# Patient Record
Sex: Female | Born: 1960 | Race: White | Hispanic: No | Marital: Married | Smoking: Never smoker
Health system: Southern US, Community
[De-identification: ages and names within clinical notes are randomized; demographics above are authoritative.]

## PROBLEM LIST (undated history)

## (undated) DIAGNOSIS — E785 Hyperlipidemia, unspecified: Secondary | ICD-10-CM

## (undated) DIAGNOSIS — R42 Dizziness and giddiness: Secondary | ICD-10-CM

## (undated) HISTORY — DX: Dizziness and giddiness: R42

## (undated) HISTORY — PX: BREAST BIOPSY: SHX20

## (undated) HISTORY — PX: TUBAL LIGATION: SHX77

## (undated) HISTORY — DX: Hyperlipidemia, unspecified: E78.5

---

## 1998-10-20 ENCOUNTER — Other Ambulatory Visit: Admission: RE | Admit: 1998-10-20 | Discharge: 1998-10-20 | Payer: Self-pay | Admitting: Family Medicine

## 2001-10-16 ENCOUNTER — Other Ambulatory Visit: Admission: RE | Admit: 2001-10-16 | Discharge: 2001-10-16 | Payer: Self-pay | Admitting: Obstetrics and Gynecology

## 2001-11-13 ENCOUNTER — Encounter: Admission: RE | Admit: 2001-11-13 | Discharge: 2001-11-13 | Payer: Self-pay | Admitting: General Surgery

## 2001-11-13 ENCOUNTER — Encounter: Payer: Self-pay | Admitting: General Surgery

## 2002-06-19 ENCOUNTER — Encounter (INDEPENDENT_AMBULATORY_CARE_PROVIDER_SITE_OTHER): Payer: Self-pay | Admitting: *Deleted

## 2002-06-19 ENCOUNTER — Ambulatory Visit (HOSPITAL_COMMUNITY): Admission: RE | Admit: 2002-06-19 | Discharge: 2002-06-19 | Payer: Self-pay | Admitting: Obstetrics and Gynecology

## 2002-10-22 ENCOUNTER — Other Ambulatory Visit: Admission: RE | Admit: 2002-10-22 | Discharge: 2002-10-22 | Payer: Self-pay | Admitting: Obstetrics and Gynecology

## 2003-11-18 ENCOUNTER — Other Ambulatory Visit: Admission: RE | Admit: 2003-11-18 | Discharge: 2003-11-18 | Payer: Self-pay | Admitting: Obstetrics and Gynecology

## 2005-01-05 ENCOUNTER — Other Ambulatory Visit: Admission: RE | Admit: 2005-01-05 | Discharge: 2005-01-05 | Payer: Self-pay | Admitting: Obstetrics and Gynecology

## 2007-04-17 ENCOUNTER — Encounter: Admission: RE | Admit: 2007-04-17 | Discharge: 2007-04-17 | Payer: Self-pay | Admitting: Obstetrics and Gynecology

## 2009-02-24 ENCOUNTER — Encounter: Admission: RE | Admit: 2009-02-24 | Discharge: 2009-02-24 | Payer: Self-pay | Admitting: Obstetrics and Gynecology

## 2010-03-30 ENCOUNTER — Encounter: Admission: RE | Admit: 2010-03-30 | Discharge: 2010-03-30 | Payer: Self-pay | Admitting: Obstetrics and Gynecology

## 2010-12-25 NOTE — Op Note (Signed)
   NAME:  Sherri Walter, Sherri Walter                           ACCOUNT NO.:  [id number]   MEDICAL RECORD NO.:  [id number]                   PATIENT TYPE:  AMB   LOCATION:  SDC                                  FACILITY:  [facility]   PHYSICIAN:  Malva Limes, M.D.                 DATE OF BIRTH:  [birth year]   DATE OF PROCEDURE:  06/19/2002  DATE OF DISCHARGE:                                 OPERATIVE REPORT   PREOPERATIVE DIAGNOSES:  Menorrhagia.   POSTOPERATIVE DIAGNOSES:  Menorrhagia.   PROCEDURE:  1. Dilatation and curettage.  2. Cryoablation of endometrial cavity.   SURGEON:  Malva Limes, M.D.   ANESTHESIA:  MAC with paracervical block.   DRAINS:  None.   ANTIBIOTICS:  Ancef 1 g.   SPECIMENS:  Endometrial curettings sent to pathology.   COMPLICATIONS:  None.   PROCEDURE:  The patient was taken to the operating room where she was placed  in the dorsal lithotomy position.  She was prepped with Hibiclens and draped  in the usual fashion for this procedure.  A sterile speculum was placed in  the vagina.  Single tooth tenaculum was applied to the anterior cervical  lip.  20 cc of 1% lidocaine was used for paracervical block.  The cervical  os was then dilated to a 27 [place].  The uterus was sounded to 8 cm.  The  cryoprobe was set to a warm-up cycle.  Once this was completed the probe was  placed in the left cornua and freezing for seven minutes was undertaken.  This probe was then thawed and placed in the opposite cornua.  Another seven  minute freeze was accomplished.  This concluded the procedure.  The patient  was placed in dorsal supine position and transferred to the recovery room in  stable condition.  She will be discharged to home.  She will be instructed  to follow up in the office in four weeks.  She was told to take Motrin as  needed.                                               Malva Limes, M.D.    MA/MEDQ  D:  06/19/2002  T:  06/19/2002  Job:  [id number]

## 2011-04-06 ENCOUNTER — Other Ambulatory Visit: Payer: Self-pay | Admitting: Obstetrics and Gynecology

## 2011-04-06 DIAGNOSIS — Z1231 Encounter for screening mammogram for malignant neoplasm of breast: Secondary | ICD-10-CM

## 2011-04-19 ENCOUNTER — Ambulatory Visit
Admission: RE | Admit: 2011-04-19 | Discharge: 2011-04-19 | Disposition: A | Discharge status: Home / Self Care | Payer: BC Managed Care – PPO | Source: Ambulatory Visit | Attending: Obstetrics and Gynecology | Admitting: Obstetrics and Gynecology

## 2011-04-19 DIAGNOSIS — Z1231 Encounter for screening mammogram for malignant neoplasm of breast: Secondary | ICD-10-CM

## 2012-06-02 ENCOUNTER — Other Ambulatory Visit: Payer: Self-pay | Admitting: Obstetrics and Gynecology

## 2012-06-02 DIAGNOSIS — Z1231 Encounter for screening mammogram for malignant neoplasm of breast: Secondary | ICD-10-CM

## 2012-06-19 ENCOUNTER — Ambulatory Visit
Admission: RE | Admit: 2012-06-19 | Discharge: 2012-06-19 | Disposition: A | Discharge status: Home / Self Care | Payer: BC Managed Care – PPO | Source: Ambulatory Visit | Attending: Obstetrics and Gynecology | Admitting: Obstetrics and Gynecology

## 2012-06-19 ENCOUNTER — Other Ambulatory Visit: Payer: Self-pay | Admitting: Obstetrics and Gynecology

## 2012-06-19 DIAGNOSIS — Z1231 Encounter for screening mammogram for malignant neoplasm of breast: Secondary | ICD-10-CM

## 2012-08-11 ENCOUNTER — Other Ambulatory Visit: Payer: Self-pay | Admitting: Dermatology

## 2013-01-31 ENCOUNTER — Telehealth: Payer: Self-pay | Admitting: Nurse Practitioner

## 2013-01-31 ENCOUNTER — Encounter: Payer: Self-pay | Admitting: General Practice

## 2013-01-31 ENCOUNTER — Ambulatory Visit (INDEPENDENT_AMBULATORY_CARE_PROVIDER_SITE_OTHER): Payer: BC Managed Care – PPO | Admitting: General Practice

## 2013-01-31 VITALS — BP 135/78 | HR 78 | Temp 98.4°F | Ht 62.0 in | Wt 183.0 lb

## 2013-01-31 DIAGNOSIS — K219 Gastro-esophageal reflux disease without esophagitis: Secondary | ICD-10-CM

## 2013-01-31 NOTE — Telephone Encounter (Signed)
appt given for today 

## 2013-01-31 NOTE — Progress Notes (Signed)
  Subjective:    Patient ID: Sherri Walter, female    DOB: Feb 17, 1961, 52 y.o.   MRN: [id number]  Wheezing  This is a new problem. The current episode started today. The problem occurs daily. The problem has been unchanged. Pertinent negatives include no abdominal pain, chest pain, chills, coughing, ear pain, fever, headaches, neck pain, rhinorrhea, shortness of breath or sore throat. Nothing aggravates the symptoms. She has tried nothing for the symptoms. There is no history of asthma or bronchiolitis.  Reports she is has decreased the amount of food food she is eating and in the past she developed acid reflux when decreasing food consumption. She also reports being stressed.     Review of Systems  Constitutional: Negative for fever and chills.  HENT: Positive for voice change. Negative for ear pain, sore throat, rhinorrhea, sneezing, neck pain, neck stiffness, postnasal drip and sinus pressure.        Slightly hoarse this am   Respiratory: Positive for wheezing. Negative for cough, chest tightness and shortness of breath.        Had one episode of wheezing upon waking up this am  Cardiovascular: Negative for chest pain.  Gastrointestinal: Negative for abdominal pain.  Neurological: Negative for dizziness, weakness and headaches.       Objective:   Physical Exam  Constitutional: She is oriented to person, place, and time. She appears well-developed and well-nourished.  HENT:  Head: Normocephalic and atraumatic.  Right Ear: External ear normal.  Left Ear: External ear normal.  Mouth/Throat: Posterior oropharyngeal erythema present.  Neck: Normal range of motion. Neck supple. No thyromegaly present.  Cardiovascular: Normal rate, regular rhythm and normal heart sounds.   Pulmonary/Chest: Effort normal and breath sounds normal. No respiratory distress. She exhibits no tenderness.  Abdominal: Soft. Bowel sounds are normal. She exhibits no distension and no mass.  Lymphadenopathy:    She  has no cervical adenopathy.  Neurological: She is alert and oriented to person, place, and time.  Skin: Skin is warm and dry.  Psychiatric: She has a normal mood and affect.          Assessment & Plan:  1. GERD (gastroesophageal reflux disease) -OTC prilosec as directed -discussed sleeping in elevated position -discussed GERD diet -RTO if symptoms worsen or unresolved -Patient verbalized understanding -Coralie Keens, FNP-C

## 2013-01-31 NOTE — Patient Instructions (Signed)
Gastroesophageal Reflux Disease, Adult  Gastroesophageal reflux disease (GERD) happens when acid from your stomach flows up into the esophagus. When acid comes in contact with the esophagus, the acid causes soreness (inflammation) in the esophagus. Over time, GERD may create small holes (ulcers) in the lining of the esophagus.  CAUSES   · Increased body weight. This puts pressure on the stomach, making acid rise from the stomach into the esophagus.  · Smoking. This increases acid production in the stomach.  · Drinking alcohol. This causes decreased pressure in the lower esophageal sphincter (valve or ring of muscle between the esophagus and stomach), allowing acid from the stomach into the esophagus.  · Late evening meals and a full stomach. This increases pressure and acid production in the stomach.  · A malformed lower esophageal sphincter.  Sometimes, no cause is found.  SYMPTOMS   · Burning pain in the lower part of the mid-chest behind the breastbone and in the mid-stomach area. This may occur twice a week or more often.  · Trouble swallowing.  · Sore throat.  · Dry cough.  · Asthma-like symptoms including chest tightness, shortness of breath, or wheezing.  DIAGNOSIS   Your caregiver may be able to diagnose GERD based on your symptoms. In some cases, X-rays and other tests may be done to check for complications or to check the condition of your stomach and esophagus.  TREATMENT   Your caregiver may recommend over-the-counter or prescription medicines to help decrease acid production. Ask your caregiver before starting or adding any new medicines.   HOME CARE INSTRUCTIONS   · Change the factors that you can control. Ask your caregiver for guidance concerning weight loss, quitting smoking, and alcohol consumption.  · Avoid foods and drinks that make your symptoms worse, such as:  · Caffeine or alcoholic drinks.  · Chocolate.  · Peppermint or mint flavorings.  · Garlic and onions.  · Spicy foods.  · Citrus fruits,  such as oranges, lemons, or limes.  · Tomato-based foods such as sauce, chili, salsa, and pizza.  · Fried and fatty foods.  · Avoid lying down for the 3 hours prior to your bedtime or prior to taking a nap.  · Eat small, frequent meals instead of large meals.  · Wear loose-fitting clothing. Do not wear anything tight around your waist that causes pressure on your stomach.  · Raise the head of your bed 6 to 8 inches with wood blocks to help you sleep. Extra pillows will not help.  · Only take over-the-counter or prescription medicines for pain, discomfort, or fever as directed by your caregiver.  · Do not take aspirin, ibuprofen, or other nonsteroidal anti-inflammatory drugs (NSAIDs).  SEEK IMMEDIATE MEDICAL CARE IF:   · You have pain in your arms, neck, jaw, teeth, or back.  · Your pain increases or changes in intensity or duration.  · You develop nausea, vomiting, or sweating (diaphoresis).  · You develop shortness of breath, or you faint.  · Your vomit is green, yellow, black, or looks like coffee grounds or blood.  · Your stool is red, bloody, or black.  These symptoms could be signs of other problems, such as heart disease, gastric bleeding, or esophageal bleeding.  MAKE SURE YOU:   · Understand these instructions.  · Will watch your condition.  · Will get help right away if you are not doing well or get worse.  Document Released: 05/05/2005 Document Revised: 10/18/2011 Document Reviewed: 02/12/2011  Exit[employer]® Patient   Information ©2014 [employer].

## 2013-02-02 ENCOUNTER — Other Ambulatory Visit: Payer: Self-pay | Admitting: General Practice

## 2013-02-02 ENCOUNTER — Telehealth: Payer: Self-pay | Admitting: General Practice

## 2013-02-02 DIAGNOSIS — J322 Chronic ethmoidal sinusitis: Secondary | ICD-10-CM

## 2013-02-02 MED ORDER — AZITHROMYCIN 250 MG PO TABS: ORAL_TABLET | ORAL | Status: DC

## 2013-02-02 NOTE — Telephone Encounter (Signed)
Patient aware.

## 2013-02-02 NOTE — Telephone Encounter (Signed)
Wants something called in, ha, pressure is worse- feels like deep sinus infection- please call in med Just seen 6/25- Mae H.   [facility] [place]

## 2013-02-02 NOTE — Telephone Encounter (Signed)
Please inform that [facility]. thx

## 2013-02-12 ENCOUNTER — Telehealth: Payer: Self-pay | Admitting: General Practice

## 2013-02-12 ENCOUNTER — Other Ambulatory Visit: Payer: Self-pay | Admitting: General Practice

## 2013-02-12 MED ORDER — AMOXICILLIN 500 MG PO CAPS: 500.0000 mg | ORAL_CAPSULE | Freq: Two times a day (BID) | ORAL | Status: DC

## 2013-02-12 NOTE — Telephone Encounter (Signed)
Please inform that amoxicillin script sent to pharmacy. If symptoms continue after completion, she will need to be seen. thx

## 2013-02-12 NOTE — Telephone Encounter (Signed)
She may want another antibiotic.

## 2013-02-12 NOTE — Telephone Encounter (Signed)
Aware. 

## 2013-06-15 ENCOUNTER — Other Ambulatory Visit: Payer: Self-pay

## 2013-06-15 DIAGNOSIS — Z1231 Encounter for screening mammogram for malignant neoplasm of breast: Secondary | ICD-10-CM

## 2013-06-22 ENCOUNTER — Other Ambulatory Visit: Payer: Self-pay | Admitting: Obstetrics and Gynecology

## 2013-07-06 ENCOUNTER — Ambulatory Visit
Admission: RE | Admit: 2013-07-06 | Discharge: 2013-07-06 | Disposition: A | Discharge status: Home / Self Care | Payer: BC Managed Care – PPO | Source: Ambulatory Visit

## 2013-07-06 DIAGNOSIS — Z1231 Encounter for screening mammogram for malignant neoplasm of breast: Secondary | ICD-10-CM

## 2014-06-11 ENCOUNTER — Other Ambulatory Visit: Payer: Self-pay

## 2014-06-11 DIAGNOSIS — Z1231 Encounter for screening mammogram for malignant neoplasm of breast: Secondary | ICD-10-CM

## 2014-06-24 ENCOUNTER — Other Ambulatory Visit: Payer: Self-pay | Admitting: Obstetrics and Gynecology

## 2014-06-25 LAB — CYTOLOGY - PAP

## 2014-07-08 ENCOUNTER — Ambulatory Visit
Admission: RE | Admit: 2014-07-08 | Discharge: 2014-07-08 | Disposition: A | Discharge status: Home / Self Care | Payer: BC Managed Care – PPO | Source: Ambulatory Visit

## 2014-07-08 DIAGNOSIS — Z1231 Encounter for screening mammogram for malignant neoplasm of breast: Secondary | ICD-10-CM

## 2015-06-03 ENCOUNTER — Other Ambulatory Visit: Payer: Self-pay

## 2015-06-03 DIAGNOSIS — Z1231 Encounter for screening mammogram for malignant neoplasm of breast: Secondary | ICD-10-CM

## 2015-06-09 ENCOUNTER — Other Ambulatory Visit: Payer: Self-pay | Admitting: Gastroenterology

## 2015-07-01 ENCOUNTER — Other Ambulatory Visit: Payer: Self-pay | Admitting: Obstetrics and Gynecology

## 2015-07-02 LAB — CYTOLOGY - PAP

## 2015-07-11 ENCOUNTER — Ambulatory Visit
Admission: RE | Admit: 2015-07-11 | Discharge: 2015-07-11 | Disposition: A | Discharge status: Home / Self Care | Payer: BLUE CROSS/BLUE SHIELD | Source: Ambulatory Visit

## 2015-07-11 DIAGNOSIS — Z1231 Encounter for screening mammogram for malignant neoplasm of breast: Secondary | ICD-10-CM

## 2015-10-03 ENCOUNTER — Ambulatory Visit (INDEPENDENT_AMBULATORY_CARE_PROVIDER_SITE_OTHER): Payer: BLUE CROSS/BLUE SHIELD | Admitting: Nurse Practitioner

## 2015-10-03 ENCOUNTER — Encounter: Payer: Self-pay | Admitting: Nurse Practitioner

## 2015-10-03 VITALS — BP 134/80 | HR 93 | Temp 101.3°F | Ht 62.0 in | Wt 182.5 lb

## 2015-10-03 DIAGNOSIS — R509 Fever, unspecified: Secondary | ICD-10-CM | POA: Diagnosis not present

## 2015-10-03 DIAGNOSIS — J069 Acute upper respiratory infection, unspecified: Secondary | ICD-10-CM

## 2015-10-03 LAB — POCT INFLUENZA A/B
INFLUENZA A, POC: NEGATIVE
Influenza B, POC: NEGATIVE

## 2015-10-03 MED ORDER — AZITHROMYCIN 250 MG PO TABS: ORAL_TABLET | ORAL | Status: DC

## 2015-10-03 NOTE — Patient Instructions (Signed)

## 2015-10-03 NOTE — Progress Notes (Signed)
   Subjective:    Patient ID: Sherri Walter, female    DOB: [birth year], 55 y.o.   MRN: [id number]  HPI  Patient in c/o cough and congestion- 101 fever 30 minutes ago. Started Wednesday with just congestion and now she has a cough. Has been taking mucinex OTC which has helped symptoms some.   Review of Systems  Constitutional: Negative for chills.  HENT: Positive for congestion, ear pain and sinus pressure. Negative for sore throat and voice change.   Respiratory: Positive for cough. Negative for shortness of breath.   Cardiovascular: Negative.   Genitourinary: Negative.   Neurological: Positive for headaches (dull).  Psychiatric/Behavioral: Negative.   All other systems reviewed and are negative.      Objective:   Physical Exam  Constitutional: She appears well-developed and well-nourished.  HENT:  Right Ear: Hearing, tympanic membrane, external ear and ear canal normal.  Left Ear: Hearing, tympanic membrane, external ear and ear canal normal.  Nose: Mucosal edema and rhinorrhea present. Right sinus exhibits maxillary sinus tenderness. Right sinus exhibits no frontal sinus tenderness. Left sinus exhibits maxillary sinus tenderness. Left sinus exhibits no frontal sinus tenderness.  Mouth/Throat: Uvula is midline, oropharynx is clear and moist and mucous membranes are normal.  Eyes: Pupils are equal, round, and reactive to light.  Neck: Normal range of motion. Neck supple.  Cardiovascular: Normal rate, regular rhythm and normal heart sounds.   Pulmonary/Chest: Effort normal and breath sounds normal.  Neurological: She is alert.  Skin: Skin is warm.  Face flushed  Psychiatric: She has a normal mood and affect. Her behavior is normal. Judgment and thought content normal.    BP 134/80 mmHg  Pulse 93  Temp(Src) 101.3 F (38.5 C) (Oral)  Ht 5\' 2"  (1.575 m)  Wt 182 lb 8 oz (82.781 kg)  BMI 33.37 kg/m2      Assessment & Plan:   1. Fever, unspecified   2. Upper respiratory  infection with cough and congestion    1. Take meds as prescribed 2. Use a cool mist humidifier especially during the winter months and when heat has been humid. 3. Use saline nose sprays frequently 4. Saline irrigations of the nose can be very helpful if done frequently.  * 4X daily for 1 week*  * Use of a nettie pot can be helpful with this. Follow directions with this* 5. Drink plenty of fluids 6. Keep thermostat turn down low 7.For any cough or congestion  Use plain Mucinex- regular strength or max strength is fine   * Children- consult with Pharmacist for dosing 8. For fever or aces or pains- take tylenol or ibuprofen appropriate for age and weight.  * for fevers greater than 101 orally you may alternate ibuprofen and tylenol every  3 hours.   Meds ordered this encounter  Medications  . azithromycin (ZITHROMAX Z-PAK) 250 MG tablet    Sig: As directed    Dispense:  1 each    Refill:  0    Order Specific Question:  Supervising Provider    Answer:  Chipper Herb [[id number]]   [place], FNP

## 2015-12-29 DIAGNOSIS — D2371 Other benign neoplasm of skin of right lower limb, including hip: Secondary | ICD-10-CM | POA: Diagnosis not present

## 2015-12-29 DIAGNOSIS — L918 Other hypertrophic disorders of the skin: Secondary | ICD-10-CM | POA: Diagnosis not present

## 2015-12-29 DIAGNOSIS — L821 Other seborrheic keratosis: Secondary | ICD-10-CM | POA: Diagnosis not present

## 2015-12-29 DIAGNOSIS — L570 Actinic keratosis: Secondary | ICD-10-CM | POA: Diagnosis not present

## 2016-02-21 ENCOUNTER — Encounter: Payer: Self-pay | Admitting: Family Medicine

## 2016-02-21 ENCOUNTER — Ambulatory Visit (INDEPENDENT_AMBULATORY_CARE_PROVIDER_SITE_OTHER): Payer: BLUE CROSS/BLUE SHIELD | Admitting: Family Medicine

## 2016-02-21 VITALS — BP 114/64 | HR 71 | Temp 97.1°F | Ht 62.0 in | Wt 187.6 lb

## 2016-02-21 DIAGNOSIS — S81849A Puncture wound with foreign body, unspecified lower leg, initial encounter: Secondary | ICD-10-CM | POA: Diagnosis not present

## 2016-02-21 DIAGNOSIS — Z23 Encounter for immunization: Secondary | ICD-10-CM | POA: Diagnosis not present

## 2016-02-21 DIAGNOSIS — W6133XA Pecked by chicken, initial encounter: Secondary | ICD-10-CM

## 2016-02-21 NOTE — Progress Notes (Signed)
BP 114/64 mmHg  Pulse 71  Temp(Src) 97.1 F (36.2 C) (Oral)  Ht 5\' 2"  (1.575 m)  Wt 187 lb 9.6 oz (85.095 kg)  BMI 34.30 kg/m2   Subjective:    Patient ID: Sherri Walter, female    DOB: 05-28-1961, 55 y.o.   MRN: [id number]  HPI: Sherri Walter is a 55 y.o. female presenting on 02/21/2016 for scratched by rooster and wants tetanus injection   HPI Rooster scratch and attack Patient was attacked by her daughters rooster a week ago. She has a few spots on her legs where she's been scratched and there were initially healing and then they started to develop a rash around him then she is vinegar and now they're healing again but she does want to get him checked and get a tetanus booster because she has not had one in some time. She denies any fevers or chills or redness or warmth. The one site that she is most concerned about is on her right calf. And she has a small healing scab that's less than 15 mm in size. She has slight tenderness to it but no warmth or drainage or redness. She says the vinegar was what helps get rid of the redness that had developed. All other sites are healing well  Relevant past medical, surgical, family and social history reviewed and updated as indicated. Interim medical history since our last visit reviewed. Allergies and medications reviewed and updated.  Review of Systems  Constitutional: Negative for fever and chills.  HENT: Negative for congestion, ear discharge and ear pain.   Eyes: Negative for redness and visual disturbance.  Respiratory: Negative for chest tightness and shortness of breath.   Cardiovascular: Negative for chest pain and leg swelling.  Genitourinary: Negative for dysuria and difficulty urinating.  Musculoskeletal: Negative for back pain and gait problem.  Skin: Positive for wound. Negative for color change and rash.  Neurological: Negative for light-headedness and headaches.  Psychiatric/Behavioral: Negative for behavioral problems and  agitation.  All other systems reviewed and are negative.   Per HPI unless specifically indicated above     Medication List    Notice  As of 02/21/2016  8:55 AM   You have not been prescribed any medications.         Objective:    BP 114/64 mmHg  Pulse 71  Temp(Src) 97.1 F (36.2 C) (Oral)  Ht 5\' 2"  (1.575 m)  Wt 187 lb 9.6 oz (85.095 kg)  BMI 34.30 kg/m2  Wt Readings from Last 3 Encounters:  02/21/16 187 lb 9.6 oz (85.095 kg)  10/03/15 182 lb 8 oz (82.781 kg)  01/31/13 183 lb (83.008 kg)    Physical Exam  Constitutional: She is oriented to person, place, and time. She appears well-developed and well-nourished. No distress.  Eyes: Conjunctivae and EOM are normal. Pupils are equal, round, and reactive to light.  Cardiovascular: Normal rate, regular rhythm, normal heart sounds and intact distal pulses.   No murmur heard. Pulmonary/Chest: Effort normal and breath sounds normal. No respiratory distress. She has no wheezes.  Musculoskeletal: Normal range of motion. She exhibits no edema or tenderness.  Neurological: She is alert and oriented to person, place, and time. Coordination normal.  Skin: Skin is warm and dry. Lesion (Small healing lesion, no erythema or warmth. Slightly tender to palpation. No induration or fluctuation noted.) noted. No rash noted. She is not diaphoretic.  Psychiatric: She has a normal mood and affect. Her behavior is normal.  Nursing note and vitals reviewed.      Assessment & Plan:   Problem List Items Addressed This Visit    None    Visit Diagnoses    Pecked by chicken, initial encounter    -  Primary    Patient was attacked by rooster, has been using vinegar and site appears to be healing. Recommended topical antibiotic and moisturizing lotion, return if worsen       Follow up plan: Return if symptoms worsen or fail to improve.  Counseling provided for all of the vaccine components Orders Placed This Encounter  Procedures  . Tdap  vaccine greater than or equal to 7yo IM    Caryl Pina, MD [facility] 02/21/2016, 8:55 AM

## 2016-03-16 DIAGNOSIS — M542 Cervicalgia: Secondary | ICD-10-CM | POA: Diagnosis not present

## 2016-03-16 DIAGNOSIS — M25511 Pain in right shoulder: Secondary | ICD-10-CM | POA: Diagnosis not present

## 2016-03-16 DIAGNOSIS — M7541 Impingement syndrome of right shoulder: Secondary | ICD-10-CM | POA: Diagnosis not present

## 2016-03-16 DIAGNOSIS — M7501 Adhesive capsulitis of right shoulder: Secondary | ICD-10-CM | POA: Diagnosis not present

## 2016-03-18 DIAGNOSIS — M7501 Adhesive capsulitis of right shoulder: Secondary | ICD-10-CM | POA: Diagnosis not present

## 2016-03-22 DIAGNOSIS — M7501 Adhesive capsulitis of right shoulder: Secondary | ICD-10-CM | POA: Diagnosis not present

## 2016-03-26 DIAGNOSIS — M7501 Adhesive capsulitis of right shoulder: Secondary | ICD-10-CM | POA: Diagnosis not present

## 2016-03-29 DIAGNOSIS — M7501 Adhesive capsulitis of right shoulder: Secondary | ICD-10-CM | POA: Diagnosis not present

## 2016-04-02 DIAGNOSIS — M7501 Adhesive capsulitis of right shoulder: Secondary | ICD-10-CM | POA: Diagnosis not present

## 2016-04-05 DIAGNOSIS — M7501 Adhesive capsulitis of right shoulder: Secondary | ICD-10-CM | POA: Diagnosis not present

## 2016-04-09 DIAGNOSIS — M7501 Adhesive capsulitis of right shoulder: Secondary | ICD-10-CM | POA: Diagnosis not present

## 2016-04-13 DIAGNOSIS — M7501 Adhesive capsulitis of right shoulder: Secondary | ICD-10-CM | POA: Diagnosis not present

## 2016-04-16 DIAGNOSIS — M7501 Adhesive capsulitis of right shoulder: Secondary | ICD-10-CM | POA: Diagnosis not present

## 2016-04-19 DIAGNOSIS — M25511 Pain in right shoulder: Secondary | ICD-10-CM | POA: Diagnosis not present

## 2016-04-19 DIAGNOSIS — M7541 Impingement syndrome of right shoulder: Secondary | ICD-10-CM | POA: Diagnosis not present

## 2016-04-19 DIAGNOSIS — M7501 Adhesive capsulitis of right shoulder: Secondary | ICD-10-CM | POA: Diagnosis not present

## 2016-04-19 DIAGNOSIS — M542 Cervicalgia: Secondary | ICD-10-CM | POA: Diagnosis not present

## 2016-06-03 ENCOUNTER — Other Ambulatory Visit: Payer: Self-pay | Admitting: Obstetrics and Gynecology

## 2016-06-03 DIAGNOSIS — Z1231 Encounter for screening mammogram for malignant neoplasm of breast: Secondary | ICD-10-CM

## 2016-07-05 ENCOUNTER — Other Ambulatory Visit: Payer: Self-pay | Admitting: Obstetrics and Gynecology

## 2016-07-05 DIAGNOSIS — Z1389 Encounter for screening for other disorder: Secondary | ICD-10-CM | POA: Diagnosis not present

## 2016-07-05 DIAGNOSIS — Z124 Encounter for screening for malignant neoplasm of cervix: Secondary | ICD-10-CM | POA: Diagnosis not present

## 2016-07-05 DIAGNOSIS — Z6835 Body mass index (BMI) 35.0-35.9, adult: Secondary | ICD-10-CM | POA: Diagnosis not present

## 2016-07-05 DIAGNOSIS — Z13220 Encounter for screening for lipoid disorders: Secondary | ICD-10-CM | POA: Diagnosis not present

## 2016-07-05 DIAGNOSIS — Z131 Encounter for screening for diabetes mellitus: Secondary | ICD-10-CM | POA: Diagnosis not present

## 2016-07-05 DIAGNOSIS — N912 Amenorrhea, unspecified: Secondary | ICD-10-CM | POA: Diagnosis not present

## 2016-07-05 DIAGNOSIS — Z01419 Encounter for gynecological examination (general) (routine) without abnormal findings: Secondary | ICD-10-CM | POA: Diagnosis not present

## 2016-07-06 LAB — CYTOLOGY - PAP

## 2016-07-12 ENCOUNTER — Ambulatory Visit
Admission: RE | Admit: 2016-07-12 | Discharge: 2016-07-12 | Disposition: A | Discharge status: Home / Self Care | Payer: BLUE CROSS/BLUE SHIELD | Source: Ambulatory Visit | Attending: Obstetrics and Gynecology | Admitting: Obstetrics and Gynecology

## 2016-07-12 DIAGNOSIS — Z1231 Encounter for screening mammogram for malignant neoplasm of breast: Secondary | ICD-10-CM

## 2016-08-10 ENCOUNTER — Encounter: Payer: Self-pay | Admitting: Family Medicine

## 2017-01-10 DIAGNOSIS — L812 Freckles: Secondary | ICD-10-CM | POA: Diagnosis not present

## 2017-01-10 DIAGNOSIS — L570 Actinic keratosis: Secondary | ICD-10-CM | POA: Diagnosis not present

## 2017-01-10 DIAGNOSIS — L821 Other seborrheic keratosis: Secondary | ICD-10-CM | POA: Diagnosis not present

## 2017-01-10 DIAGNOSIS — D2271 Melanocytic nevi of right lower limb, including hip: Secondary | ICD-10-CM | POA: Diagnosis not present

## 2017-04-04 ENCOUNTER — Encounter: Payer: Self-pay | Admitting: Family Medicine

## 2017-04-04 ENCOUNTER — Ambulatory Visit (INDEPENDENT_AMBULATORY_CARE_PROVIDER_SITE_OTHER): Payer: BLUE CROSS/BLUE SHIELD | Admitting: Family Medicine

## 2017-04-04 VITALS — BP 130/75 | HR 73 | Temp 97.8°F | Ht 62.0 in | Wt 185.0 lb

## 2017-04-04 DIAGNOSIS — R112 Nausea with vomiting, unspecified: Secondary | ICD-10-CM | POA: Diagnosis not present

## 2017-04-04 MED ORDER — ONDANSETRON HCL 4 MG PO TABS: 4.0000 mg | ORAL_TABLET | Freq: Three times a day (TID) | ORAL | 0 refills | Status: DC | PRN

## 2017-04-04 MED ORDER — PROMETHAZINE HCL 25 MG/ML IJ SOLN
25.0000 mg | Freq: Once | INTRAMUSCULAR | Status: AC
  Administered 2017-04-04: 25 mg via INTRAMUSCULAR

## 2017-04-04 NOTE — Patient Instructions (Signed)
Great to meet you!  Please let us know if your symptoms worsen or do not resolve as expected.    This is likely a viral GI infection or food poisoning, either one should resolve generally within 48 hours.   Nausea and Vomiting, Adult Feeling sick to your stomach (nausea) means that your stomach is upset or you feel like you have to throw up (vomit). Feeling more and more sick to your stomach can lead to throwing up. Throwing up happens when food and liquid from your stomach are thrown up and out the mouth. Throwing up can make you feel weak and cause you to get dehydrated. Dehydration can make you tired and thirsty, make you have a dry mouth, and make it so you pee (urinate) less often. Older adults and people with other diseases or a weak defense system (immune system) are at higher risk for dehydration. If you feel sick to your stomach or if you throw up, it is important to follow instructions from your doctor about how to take care of yourself. Follow these instructions at home: Eating and drinking Follow these instructions as told by your doctor:  Take an oral rehydration solution (ORS). This is a drink that is sold at pharmacies and stores.  Drink clear fluids in small amounts as you are able, such as: ? Water. ? Ice chips. ? Diluted fruit juice. ? Low-calorie sports drinks.  Eat bland, easy-to-digest foods in small amounts as you are able, such as: ? Bananas. ? Applesauce. ? Rice. ? Low-fat (lean) meats. ? Toast. ? Crackers.  Avoid fluids that have a lot of sugar or caffeine in them.  Avoid alcohol.  Avoid spicy or fatty foods.  General instructions  Drink enough fluid to keep your pee (urine) clear or pale yellow.  Wash your hands often. If you cannot use soap and water, use hand sanitizer.  Make sure that all people in your home wash their hands well and often.  Take over-the-counter and prescription medicines only as told by your doctor.  Rest at home while you  get better.  Watch your condition for any changes.  Breathe slowly and deeply when you feel sick to your stomach.  Keep all follow-up visits as told by your doctor. This is important. Contact a doctor if:  You have a fever.  You cannot keep fluids down.  Your symptoms get worse.  You have new symptoms.  You feel sick to your stomach for more than two days.  You feel light-headed or dizzy.  You have a headache.  You have muscle cramps. Get help right away if:  You have pain in your chest, neck, arm, or jaw.  You feel very weak or you pass out (faint).  You throw up again and again.  You see blood in your throw-up.  Your throw-up looks like black coffee grounds.  You have bloody or black poop (stools) or poop that look like tar.  You have a very bad headache, a stiff neck, or both.  You have a rash.  You have very bad pain, cramping, or bloating in your belly (abdomen).  You have trouble breathing.  You are breathing very quickly.  Your heart is beating very quickly.  Your skin feels cold and clammy.  You feel confused.  You have pain when you pee.  You have signs of dehydration, such as: ? Dark pee, hardly any pee, or no pee. ? Cracked lips. ? Dry mouth. ? Sunken eyes. ? Sleepiness. ?  Weakness. These symptoms may be an emergency. Do not wait to see if the symptoms will go away. Get medical help right away. Call your local emergency services (911 in the [place] Do not drive yourself to the hospital. This information is not intended to replace advice given to you by your health care provider. Make sure you discuss any questions you have with your health care provider. Document Released: 01/12/2008 Document Revised: 02/13/2016 Document Reviewed: 04/01/2015 Elsevier Interactive Patient Education  2018 [employer].

## 2017-04-04 NOTE — Progress Notes (Signed)
   HPI  Patient presents today here with nausea and vomiting.  Patient's lines of her symptoms started this morning at 3 AM, she's had 10 or more episodes of vomiting and diarrhea. She states that her vomiting started out as food contents and ended clear and mucousy  Her stools became watery over the process.  She denies abdominal pain. She denies any suspect foods or sick contacts. She did take care of her 66-month-old granddaughter yesterday.  She states that the very last vomiting episode did have a few bloody streaks in it  Symptoms have almost completely stopped over the last hour and a half.  She is tolerating small sips of fluid without vomiting, she does have continued nausea  PMH: Smoking status noted ROS: Per HPI  Objective: BP 130/75   Pulse 73   Temp 97.8 F (36.6 C) (Oral)   Ht 5\' 2"  (1.575 m)   Wt 185 lb (83.9 kg)   BMI 33.84 kg/m  Gen: NAD, alert, cooperative with exam HEENT: NCAT CV: RRR, good S1/S2, no murmur Resp: CTABL, no wheezes, non-labored Abd: SNTND, BS present, no guarding or organomegaly Ext: No edema, warm Neuro: Alert and oriented, No gross deficits  Assessment and plan:  # Nausea and vomiting Patient requesting IM injection of nausea medicine today, Phenergan given Zofran for continued symptoms if needed Symptoms appear to be finishing Likely gastroenteritis versus food poisoning. Discussed red flags, return to clinic as needed    Meds ordered this encounter  Medications  . Omega-3 Fatty Acids (FISH OIL) 1000 MG CAPS    Sig: Take by mouth.  . Vitamin D, Ergocalciferol, (DRISDOL) 50000 units CAPS capsule    Sig: Take 50,000 Units by mouth every 7 (seven) days.  . Biotin 10000 MCG TABS    Sig: Take by mouth.  . promethazine (PHENERGAN) injection 25 mg  . ondansetron (ZOFRAN) 4 MG tablet    Sig: Take 1 tablet (4 mg total) by mouth every 8 (eight) hours as needed for nausea or vomiting.    Dispense:  20 tablet    Refill:  0     Laroy Apple, MD [facility] 04/04/2017, 9:34 AM

## 2017-06-15 ENCOUNTER — Other Ambulatory Visit: Payer: Self-pay | Admitting: Obstetrics and Gynecology

## 2017-06-15 DIAGNOSIS — Z1231 Encounter for screening mammogram for malignant neoplasm of breast: Secondary | ICD-10-CM

## 2017-07-11 DIAGNOSIS — Z124 Encounter for screening for malignant neoplasm of cervix: Secondary | ICD-10-CM | POA: Diagnosis not present

## 2017-07-11 DIAGNOSIS — Z6835 Body mass index (BMI) 35.0-35.9, adult: Secondary | ICD-10-CM | POA: Diagnosis not present

## 2017-07-11 DIAGNOSIS — Z01419 Encounter for gynecological examination (general) (routine) without abnormal findings: Secondary | ICD-10-CM | POA: Diagnosis not present

## 2017-07-15 ENCOUNTER — Ambulatory Visit
Admission: RE | Admit: 2017-07-15 | Discharge: 2017-07-15 | Disposition: A | Discharge status: Home / Self Care | Payer: BLUE CROSS/BLUE SHIELD | Source: Ambulatory Visit | Attending: Obstetrics and Gynecology | Admitting: Obstetrics and Gynecology

## 2017-07-15 DIAGNOSIS — Z1231 Encounter for screening mammogram for malignant neoplasm of breast: Secondary | ICD-10-CM | POA: Diagnosis not present

## 2017-07-25 ENCOUNTER — Encounter: Payer: Self-pay | Admitting: Nurse Practitioner

## 2017-07-25 ENCOUNTER — Ambulatory Visit: Payer: BLUE CROSS/BLUE SHIELD | Admitting: Nurse Practitioner

## 2017-07-25 VITALS — BP 137/73 | HR 89 | Temp 98.5°F | Ht 62.0 in | Wt 193.0 lb

## 2017-07-25 DIAGNOSIS — J0100 Acute maxillary sinusitis, unspecified: Secondary | ICD-10-CM

## 2017-07-25 MED ORDER — AMOXICILLIN 875 MG PO TABS
875.0000 mg | ORAL_TABLET | Freq: Two times a day (BID) | ORAL | 0 refills | Status: DC
Start: 2017-07-25 — End: 2018-03-18

## 2017-07-25 NOTE — Patient Instructions (Signed)

## 2017-07-25 NOTE — Progress Notes (Signed)
   Subjective:    Patient ID: Sherri Walter, female    DOB: [birth year], 56 y.o.   MRN: [id number]  HPI Patient comes  In c/o headache and congestion for several days. Had fever last night. Headache and sinus pressure with pressure in ears.    Review of Systems  Constitutional: Positive for chills and fever.  HENT: Positive for congestion, ear pain, sinus pressure and sinus pain. Negative for sore throat and trouble swallowing.   Respiratory: Positive for cough (slight).   Cardiovascular: Negative.   Gastrointestinal: Negative.   Genitourinary: Negative.   Neurological: Negative.   Psychiatric/Behavioral: Negative.   All other systems reviewed and are negative.      Objective:   Physical Exam  Constitutional: She is oriented to person, place, and time. She appears well-developed and well-nourished. No distress.  HENT:  Right Ear: Hearing, tympanic membrane, external ear and ear canal normal.  Left Ear: Hearing, tympanic membrane, external ear and ear canal normal.  Nose: Mucosal edema and rhinorrhea present. Right sinus exhibits maxillary sinus tenderness. Right sinus exhibits no frontal sinus tenderness. Left sinus exhibits maxillary sinus tenderness. Left sinus exhibits no frontal sinus tenderness.  Mouth/Throat: Uvula is midline, oropharynx is clear and moist and mucous membranes are normal.  Neck: Normal range of motion. Neck supple.  Cardiovascular: Normal rate and regular rhythm.  Pulmonary/Chest: Effort normal and breath sounds normal.  Abdominal: Soft. Bowel sounds are normal.  Lymphadenopathy:    She has no cervical adenopathy.  Neurological: She is alert and oriented to person, place, and time.  Skin: Skin is warm.  Psychiatric: She has a normal mood and affect. Her behavior is normal. Judgment and thought content normal.   BP 137/73   Pulse 89   Temp 98.5 F (36.9 C) (Oral)   Ht 5\' 2"  (1.575 m)   Wt 193 lb (87.5 kg)   BMI 35.30 kg/m       Assessment & Plan:     1. Acute maxillary sinusitis, recurrence not specified    Meds ordered this encounter  Medications  . amoxicillin (AMOXIL) 875 MG tablet    Sig: Take 1 tablet (875 mg total) by mouth 2 (two) times daily. 1 po BID    Dispense:  20 tablet    Refill:  0    Order Specific Question:   Supervising Provider    Answer:   VINCENT, CAROL L [[id number]]   1. Take meds as prescribed 2. Use a cool mist humidifier especially during the winter months and when heat has been humid. 3. Use saline nose sprays frequently 4. Saline irrigations of the nose can be very helpful if done frequently.  * 4X daily for 1 week*  * Use of a nettie pot can be helpful with this. Follow directions with this* 5. Drink plenty of fluids 6. Keep thermostat turn down low 7.For any cough or congestion  Use plain Mucinex- regular strength or max strength is fine   * Children- consult with Pharmacist for dosing 8. For fever or aces or pains- take tylenol or ibuprofen appropriate for age and weight.  * for fevers greater than 101 orally you may alternate ibuprofen and tylenol every  3 hours.   Mary-Margaret Hassell Done, FNP

## 2017-08-08 IMAGING — MG MM SCREEN MAMMOGRAM BILATERAL
4 series · 4 of 4 positions shown · non-contrast
Comparison: Previous exam(s).

CLINICAL DATA: Screening.

EXAM:
DIGITAL SCREENING BILATERAL MAMMOGRAM WITH CAD

[R CC]
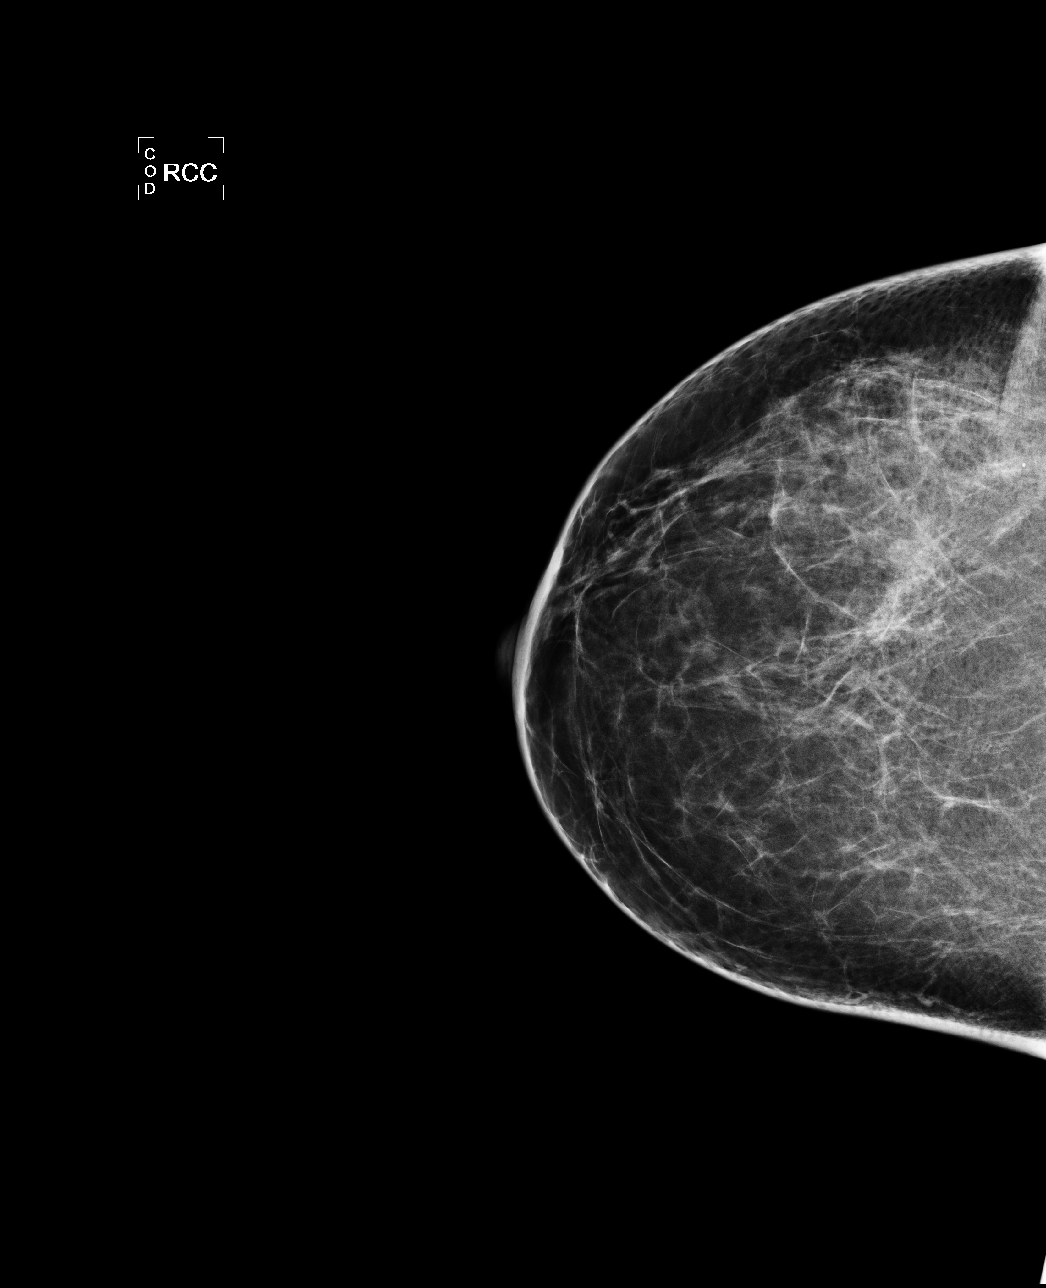

[L CC]
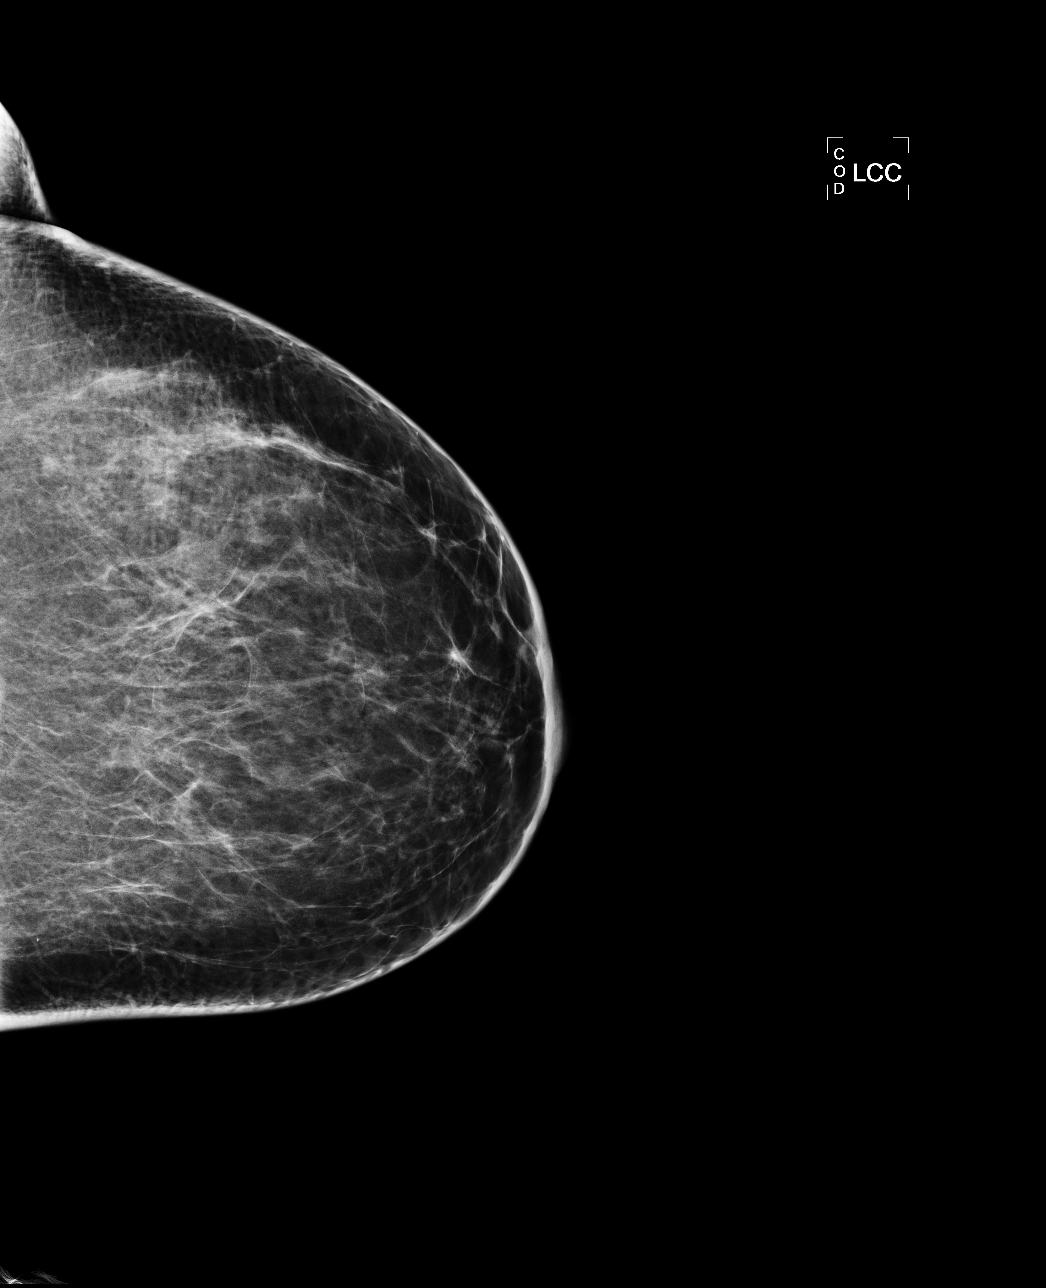

[L MLO]
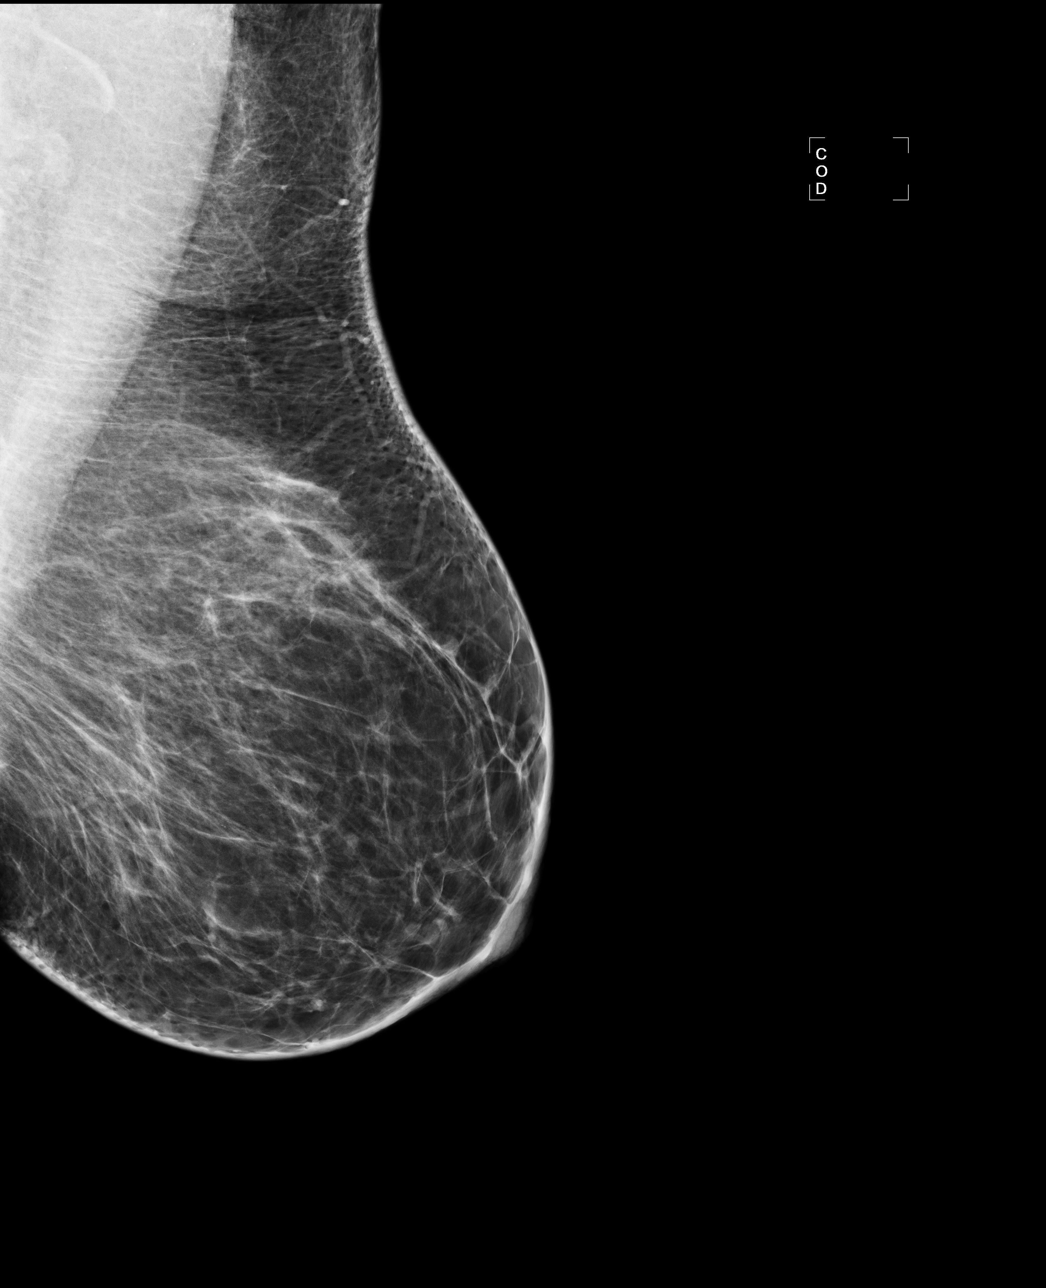

[R MLO]
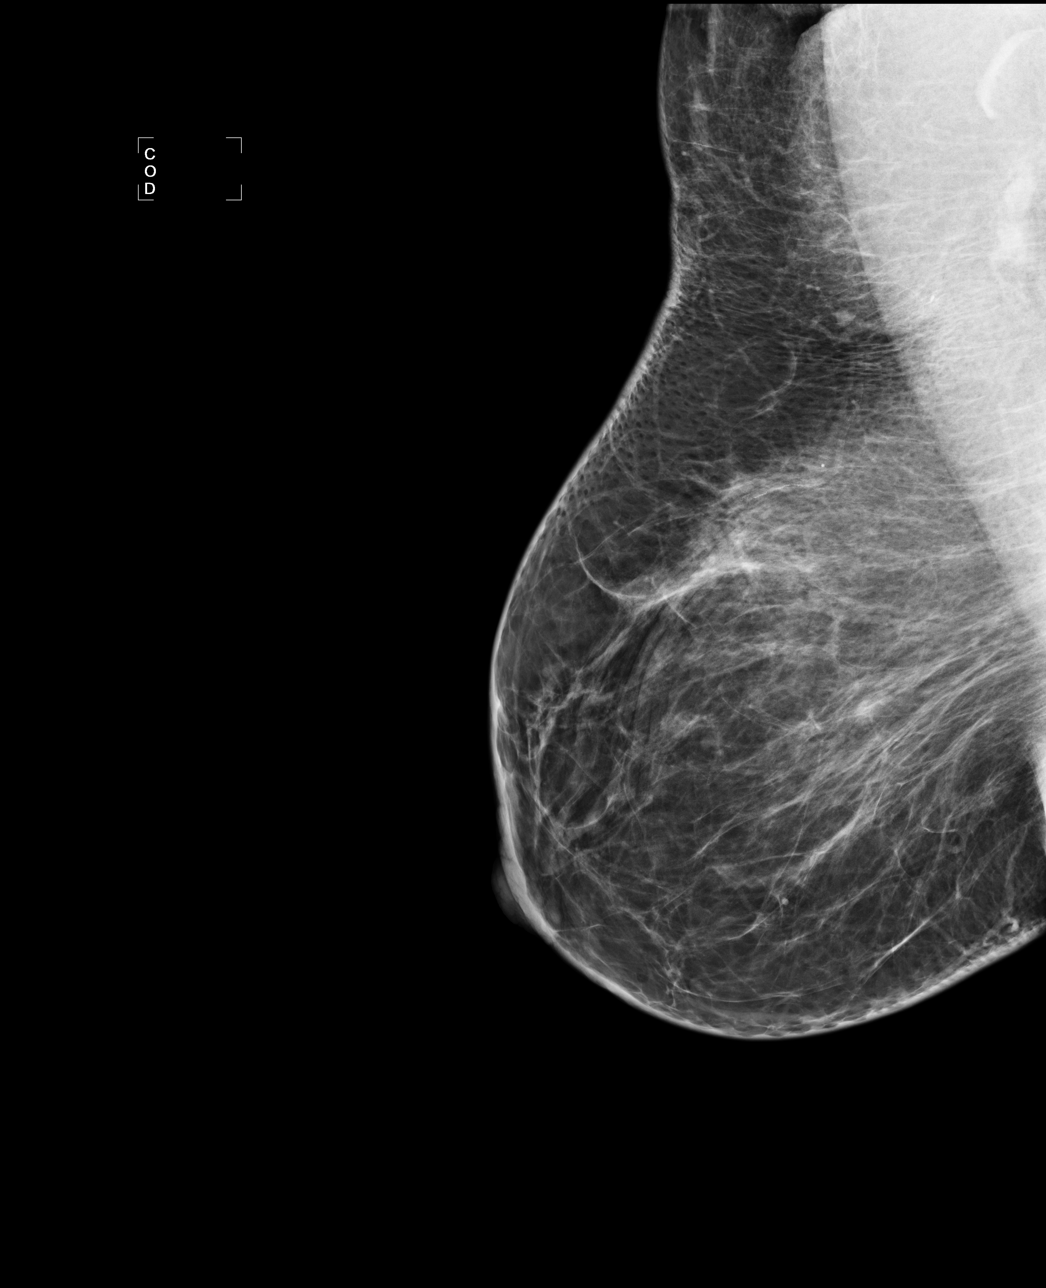

[4 of 4 positions shown; findings below may reference images not displayed]

ACR Breast Density Category b: There are scattered areas of
fibroglandular density.
FINDINGS: There are no findings suspicious for malignancy. Images were
processed with CAD.
IMPRESSION: No mammographic evidence of malignancy. A result letter of this
screening mammogram will be mailed directly to the patient.

RECOMMENDATION:
Screening mammogram in one year. (Code:AS-G-LCT)

BI-RADS CATEGORY  1: Negative.

## 2017-09-19 DIAGNOSIS — L570 Actinic keratosis: Secondary | ICD-10-CM | POA: Diagnosis not present

## 2017-09-19 DIAGNOSIS — L820 Inflamed seborrheic keratosis: Secondary | ICD-10-CM | POA: Diagnosis not present

## 2018-03-06 DIAGNOSIS — D2371 Other benign neoplasm of skin of right lower limb, including hip: Secondary | ICD-10-CM | POA: Diagnosis not present

## 2018-03-06 DIAGNOSIS — L570 Actinic keratosis: Secondary | ICD-10-CM | POA: Diagnosis not present

## 2018-03-06 DIAGNOSIS — L821 Other seborrheic keratosis: Secondary | ICD-10-CM | POA: Diagnosis not present

## 2018-03-06 DIAGNOSIS — L812 Freckles: Secondary | ICD-10-CM | POA: Diagnosis not present

## 2018-03-06 DIAGNOSIS — L438 Other lichen planus: Secondary | ICD-10-CM | POA: Diagnosis not present

## 2018-03-06 DIAGNOSIS — L820 Inflamed seborrheic keratosis: Secondary | ICD-10-CM | POA: Diagnosis not present

## 2018-03-18 ENCOUNTER — Other Ambulatory Visit: Payer: Self-pay | Admitting: Physician Assistant

## 2018-03-18 ENCOUNTER — Encounter: Payer: Self-pay | Admitting: Physician Assistant

## 2018-03-18 ENCOUNTER — Ambulatory Visit: Payer: BLUE CROSS/BLUE SHIELD | Admitting: Physician Assistant

## 2018-03-18 VITALS — BP 141/80 | HR 72 | Temp 97.1°F | Ht 62.0 in | Wt 200.0 lb

## 2018-03-18 DIAGNOSIS — M109 Gout, unspecified: Secondary | ICD-10-CM | POA: Diagnosis not present

## 2018-03-18 MED ORDER — ALLOPURINOL 100 MG PO TABS: 100.0000 mg | ORAL_TABLET | Freq: Every day | ORAL | 6 refills | Status: DC

## 2018-03-18 MED ORDER — ALLOPURINOL 100 MG PO TABS: 100.0000 mg | ORAL_TABLET | Freq: Three times a day (TID) | ORAL | 6 refills | Status: DC

## 2018-03-18 NOTE — Progress Notes (Signed)
BP (!) 141/80   Pulse 72   Temp (!) 97.1 F (36.2 C) (Oral)   Ht 5\' 2"  (1.575 m)   Wt 200 lb (90.7 kg)   BMI 36.58 kg/m    Subjective:    Patient ID: Sherri Walter, female    DOB: 12-Apr-1961, 57 y.o.   MRN: [id number]  HPI: Sherri Walter is a 57 y.o. female presenting on 03/18/2018 for right great toe pain  The patient work this morning and her right MTP joint was very swollen and painful.  It was slightly red.  She has never had an issue with gout before yesterday when she got home she did have a little bit of tightness in that toe and she remembers noticing but this morning it was greatly worsened.  She denies any fever or chills.  There is no family history of gout.   Past Medical History:  Diagnosis Date  . Dyslipidemia   . Vertigo    Relevant past medical, surgical, family and social history reviewed and updated as indicated. Interim medical history since our last visit reviewed. Allergies and medications reviewed and updated. DATA REVIEWED: CHART IN EPIC  Family History reviewed for pertinent findings.  Review of Systems  Constitutional: Negative.   HENT: Negative.   Eyes: Negative.   Respiratory: Negative.   Gastrointestinal: Negative.   Genitourinary: Negative.   Musculoskeletal: Positive for arthralgias and joint swelling.    Allergies as of 03/18/2018      Reactions   Morphine And Related    Nausea,itching   Sulfa Antibiotics       Medication List        Accurate as of 03/18/18  8:43 AM. Always use your most recent med list.          allopurinol 100 MG tablet Commonly known as:  ZYLOPRIM Take 1 tablet (100 mg total) by mouth daily.   Biotin 10000 MCG Tabs Take by mouth.   Fish Oil 1000 MG Caps Take by mouth.   VITAMIN D PO Take by mouth.          Objective:    BP (!) 141/80   Pulse 72   Temp (!) 97.1 F (36.2 C) (Oral)   Ht 5\' 2"  (1.575 m)   Wt 200 lb (90.7 kg)   BMI 36.58 kg/m   Allergies  Allergen Reactions  . Morphine  And Related     Nausea,itching  . Sulfa Antibiotics     Wt Readings from Last 3 Encounters:  03/18/18 200 lb (90.7 kg)  07/25/17 193 lb (87.5 kg)  04/04/17 185 lb (83.9 kg)    Physical Exam  Constitutional: She is oriented to person, place, and time. She appears well-developed and well-nourished.  HENT:  Head: Normocephalic and atraumatic.  Eyes: Pupils are equal, round, and reactive to light. Conjunctivae and EOM are normal.  Cardiovascular: Normal rate, regular rhythm, normal heart sounds and intact distal pulses.  Pulmonary/Chest: Effort normal and breath sounds normal.  Abdominal: Soft. Bowel sounds are normal.  Musculoskeletal:       Right foot: There is decreased range of motion, tenderness and swelling. There is no crepitus and no deformity.       Feet:  Neurological: She is alert and oriented to person, place, and time. She has normal reflexes.  Skin: Skin is warm and dry. No rash noted.  Psychiatric: She has a normal mood and affect. Her behavior is normal. Judgment and thought content normal.  Assessment & Plan:   1. Acute gout involving toe of right foot, unspecified cause Allopurinol 100 mg 1 3 times daily Ibuprofen 800 mg 3 times daily   Continue all other maintenance medications as listed above.  Follow up plan: Return if symptoms worsen or fail to improve.  Educational handout given for gout  Terald Sleeper PA-C [facility] [address]  [place], [place] [address] [phone]   03/18/2018, 8:43 AM

## 2018-03-18 NOTE — Patient Instructions (Signed)

## 2018-05-02 DIAGNOSIS — Z6836 Body mass index (BMI) 36.0-36.9, adult: Secondary | ICD-10-CM | POA: Diagnosis not present

## 2018-05-02 DIAGNOSIS — N950 Postmenopausal bleeding: Secondary | ICD-10-CM | POA: Diagnosis not present

## 2018-05-02 DIAGNOSIS — R102 Pelvic and perineal pain: Secondary | ICD-10-CM | POA: Diagnosis not present

## 2018-06-12 ENCOUNTER — Other Ambulatory Visit: Payer: Self-pay | Admitting: Obstetrics and Gynecology

## 2018-06-12 DIAGNOSIS — Z1231 Encounter for screening mammogram for malignant neoplasm of breast: Secondary | ICD-10-CM

## 2018-07-24 ENCOUNTER — Ambulatory Visit
Admission: RE | Admit: 2018-07-24 | Discharge: 2018-07-24 | Disposition: A | Discharge status: Home / Self Care | Payer: BLUE CROSS/BLUE SHIELD | Source: Ambulatory Visit | Attending: Obstetrics and Gynecology | Admitting: Obstetrics and Gynecology

## 2018-07-24 DIAGNOSIS — Z1231 Encounter for screening mammogram for malignant neoplasm of breast: Secondary | ICD-10-CM | POA: Diagnosis not present

## 2018-07-24 DIAGNOSIS — N951 Menopausal and female climacteric states: Secondary | ICD-10-CM | POA: Diagnosis not present

## 2018-07-24 DIAGNOSIS — Z124 Encounter for screening for malignant neoplasm of cervix: Secondary | ICD-10-CM | POA: Diagnosis not present

## 2018-07-24 DIAGNOSIS — Z6836 Body mass index (BMI) 36.0-36.9, adult: Secondary | ICD-10-CM | POA: Diagnosis not present

## 2018-07-24 DIAGNOSIS — Z01419 Encounter for gynecological examination (general) (routine) without abnormal findings: Secondary | ICD-10-CM | POA: Diagnosis not present

## 2018-08-14 ENCOUNTER — Ambulatory Visit (INDEPENDENT_AMBULATORY_CARE_PROVIDER_SITE_OTHER): Payer: BLUE CROSS/BLUE SHIELD | Admitting: Family Medicine

## 2018-08-14 ENCOUNTER — Encounter: Payer: Self-pay | Admitting: Family Medicine

## 2018-08-14 VITALS — BP 138/72 | HR 88 | Temp 98.9°F | Ht 62.0 in | Wt 198.0 lb

## 2018-08-14 DIAGNOSIS — J0100 Acute maxillary sinusitis, unspecified: Secondary | ICD-10-CM

## 2018-08-14 MED ORDER — FLUTICASONE PROPIONATE 50 MCG/ACT NA SUSP: 2.0000 | Freq: Every day | NASAL | 6 refills | Status: DC

## 2018-08-14 MED ORDER — AMOXICILLIN-POT CLAVULANATE 875-125 MG PO TABS: 1.0000 | ORAL_TABLET | Freq: Two times a day (BID) | ORAL | 0 refills | Status: AC

## 2018-08-14 NOTE — Patient Instructions (Signed)
It appears that you have a viral upper respiratory infection (cold).  Cold symptoms can last up to 2 weeks.    - Get plenty of rest and drink plenty of fluids. - Try to breathe moist air. Use a cold mist humidifier. - Consume warm fluids (soup or tea) to provide relief for a stuffy nose and to loosen phlegm. - For cough and congestion you can use plain Mucinex, regular or max strength, follow box directions.  - For nasal stuffiness, try saline nasal spray or a Neti Pot. You can use saline nasal spray 4 times daily. Do not use tap water in the [facility], follow instructions on box for proper use. Afrin nasal spray can also be used but this product should not be used longer than 3 days or it will cause rebound nasal stuffiness (worsening nasal congestion). - For sore throat pain relief: use chloraseptic spray, suck on throat lozenges, hard candy or popsicles; gargle with warm salt water (1/4 tsp. salt per 8 oz. of water); and eat soft, bland foods. - For fever or aches and pains take tylenol or motrin as appropriate for age and weight.  - Eat a well-balanced diet. If you cannot, ensure you are getting enough nutrients by taking a daily multivitamin. - Avoid dairy products, as they can thicken phlegm. - Avoid alcohol, as it impairs your body's immune system. - Change your toothbrush in 3 days.   CONTACT YOUR DOCTOR IF YOU EXPERIENCE ANY OF THE FOLLOWING: - High fever - Ear pain - Sinus-type headache - Unusually severe cold symptoms - Cough that gets worse while other cold symptoms improve - Flare up of any chronic lung problem, such as asthma - Your symptoms persist longer than 2 weeks    Sinusitis, Adult Sinusitis is soreness and swelling (inflammation) of your sinuses. Sinuses are hollow spaces in the bones around your face. They are located:  Around your eyes.  In the middle of your forehead.  Behind your nose.  In your cheekbones. Your sinuses and nasal passages are lined with a  fluid called mucus. Mucus drains out of your sinuses. Swelling can trap mucus in your sinuses. This lets germs (bacteria, virus, or fungus) grow, which leads to infection. Most of the time, this condition is caused by a virus. What are the causes? This condition is caused by:  Allergies.  Asthma.  Germs.  Things that block your nose or sinuses.  Growths in the nose (nasal polyps).  Chemicals or irritants in the air.  Fungus (rare). What increases the risk? You are more likely to develop this condition if:  You have a weak body defense system (immune system).  You do a lot of swimming or diving.  You use nasal sprays too much.  You smoke. What are the signs or symptoms? The main symptoms of this condition are pain and a feeling of pressure around the sinuses. Other symptoms include:  Stuffy nose (congestion).  Runny nose (drainage).  Swelling and warmth in the sinuses.  Headache.  Toothache.  A cough that may get worse at night.  Mucus that collects in the throat or the back of the nose (postnasal drip).  Being unable to smell and taste.  Being very tired (fatigue).  A fever.  Sore throat.  Bad breath. How is this diagnosed? This condition is diagnosed based on:  Your symptoms.  Your medical history.  A physical exam.  Tests to find out if your condition is short-term (acute) or long-term (chronic). Your  doctor may: ? Check your nose for growths (polyps). ? Check your sinuses using a tool that has a light (endoscope). ? Check for allergies or germs. ? Do imaging tests, such as an MRI or CT scan. How is this treated? Treatment for this condition depends on the cause and whether it is short-term or long-term.  If caused by a virus, your symptoms should go away on their own within 10 days. You may be given medicines to relieve symptoms. They include: ? Medicines that shrink swollen tissue in the nose. ? Medicines that treat allergies  (antihistamines). ? A spray that treats swelling of the nostrils. ? Rinses that help get rid of thick mucus in your nose (nasal saline washes).  If caused by bacteria, your doctor may wait to see if you will get better without treatment. You may be given antibiotic medicine if you have: ? A very bad infection. ? A weak body defense system.  If caused by growths in the nose, you may need to have surgery. Follow these instructions at home: Medicines  Take, use, or apply over-the-counter and prescription medicines only as told by your doctor. These may include nasal sprays.  If you were prescribed an antibiotic medicine, take it as told by your doctor. Do not stop taking the antibiotic even if you start to feel better. Hydrate and humidify   Drink enough water to keep your pee (urine) pale yellow.  Use a cool mist humidifier to keep the humidity level in your home above 50%.  Breathe in steam for 10-15 minutes, 3-4 times a day, or as told by your doctor. You can do this in the bathroom while a hot shower is running.  Try not to spend time in cool or dry air. Rest  Rest as much as you can.  Sleep with your head raised (elevated).  Make sure you get enough sleep each night. General instructions   Put a warm, moist washcloth on your face 3-4 times a day, or as often as told by your doctor. This will help with discomfort.  Wash your hands often with soap and water. If there is no soap and water, use hand sanitizer.  Do not smoke. Avoid being around people who are smoking (secondhand smoke).  Keep all follow-up visits as told by your doctor. This is important. Contact a doctor if:  You have a fever.  Your symptoms get worse.  Your symptoms do not get better within 10 days. Get help right away if:  You have a very bad headache.  You cannot stop throwing up (vomiting).  You have very bad pain or swelling around your face or eyes.  You have trouble seeing.  You feel  confused.  Your neck is stiff.  You have trouble breathing. Summary  Sinusitis is swelling of your sinuses. Sinuses are hollow spaces in the bones around your face.  This condition is caused by tissues in your nose that become inflamed or swollen. This traps germs. These can lead to infection.  If you were prescribed an antibiotic medicine, take it as told by your doctor. Do not stop taking it even if you start to feel better.  Keep all follow-up visits as told by your doctor. This is important. This information is not intended to replace advice given to you by your health care provider. Make sure you discuss any questions you have with your health care provider. Document Released: 01/12/2008 Document Revised: 12/26/2017 Document Reviewed: 12/26/2017 Elsevier Interactive Patient Education  2019 [place].

## 2018-08-14 NOTE — Progress Notes (Signed)
Subjective:    Patient ID: Sherri Walter, female    DOB: 1960/10/02, 57 y.o.   MRN: [id number]  Chief Complaint:  Sinusitis (sinus congestion, pressure, drainage, headache, had fever yesterday)   HPI: Sherri Walter is a 58 y.o. female presenting on 08/14/2018 for Sinusitis (sinus congestion, pressure, drainage, headache, had fever yesterday)  Pt presents today with complaints headache, sore throat, sinus pressure, fever, chills, fatigue, and cough. Pt reports this started yesterday and is progressively getting worse. Pt states she has only tried tylenol and saline nasal spray for the symptoms.  Relevant past medical, surgical, family, and social history reviewed and updated as indicated.  Allergies and medications reviewed and updated.   Past Medical History:  Diagnosis Date  . Dyslipidemia   . Vertigo     Past Surgical History:  Procedure Laterality Date  . TUBAL LIGATION      Social History   Socioeconomic History  . Marital status: Married    Spouse name: Not on file  . Number of children: Not on file  . Years of education: Not on file  . Highest education level: Not on file  Occupational History  . Not on file  Social Needs  . Financial resource strain: Not on file  . Food insecurity:    Worry: Not on file    Inability: Not on file  . Transportation needs:    Medical: Not on file    Non-medical: Not on file  Tobacco Use  . Smoking status: Never Smoker  . Smokeless tobacco: Never Used  Substance and Sexual Activity  . Alcohol use: Yes    Comment: some  . Drug use: No  . Sexual activity: Not on file  Lifestyle  . Physical activity:    Days per week: Not on file    Minutes per session: Not on file  . Stress: Not on file  Relationships  . Social connections:    Talks on phone: Not on file    Gets together: Not on file    Attends religious service: Not on file    Active member of club or organization: Not on file    Attends meetings of clubs  or organizations: Not on file    Relationship status: Not on file  . Intimate partner violence:    Fear of current or ex partner: Not on file    Emotionally abused: Not on file    Physically abused: Not on file    Forced sexual activity: Not on file  Other Topics Concern  . Not on file  Social History Narrative  . Not on file    Outpatient Encounter Medications as of 08/14/2018  Medication Sig  . allopurinol (ZYLOPRIM) 100 MG tablet Take 1 tablet (100 mg total) by mouth 3 (three) times daily.  . Biotin 10000 MCG TABS Take by mouth.  . Cholecalciferol (VITAMIN D PO) Take by mouth.  . Omega-3 Fatty Acids (FISH OIL) 1000 MG CAPS Take by mouth.  Marland Kitchen amoxicillin-clavulanate (AUGMENTIN) 875-125 MG tablet Take 1 tablet by mouth 2 (two) times daily for 7 days.  . fluticasone (FLONASE) 50 MCG/ACT nasal spray Place 2 sprays into both nostrils daily.   No facility-administered encounter medications on file as of 08/14/2018.     Allergies  Allergen Reactions  . Morphine And Related     Nausea,itching  . Sulfa Antibiotics     Review of Systems  Constitutional: Positive for activity change, chills, fatigue and fever.  HENT:  Positive for congestion, postnasal drip, rhinorrhea, sinus pressure, sinus pain and sore throat.   Respiratory: Positive for cough. Negative for shortness of breath.   Cardiovascular: Negative for chest pain, palpitations and leg swelling.  Gastrointestinal: Negative for abdominal pain.  Musculoskeletal: Negative for arthralgias and myalgias.  Neurological: Positive for headaches. Negative for dizziness, weakness and light-headedness.  Psychiatric/Behavioral: Negative for confusion.  All other systems reviewed and are negative.       Objective:    BP 138/72   Pulse 88   Temp 98.9 F (37.2 C) (Oral)   Ht 5\' 2"  (1.575 m)   Wt 198 lb (89.8 kg)   BMI 36.21 kg/m    Wt Readings from Last 3 Encounters:  08/14/18 198 lb (89.8 kg)  03/18/18 200 lb (90.7 kg)    07/25/17 193 lb (87.5 kg)    Physical Exam Vitals signs and nursing note reviewed.  Constitutional:      General: She is in acute distress (mild).     Appearance: Normal appearance. She is well-developed and well-groomed. She is obese.  HENT:     Head: Normocephalic and atraumatic.     Jaw: There is normal jaw occlusion.     Right Ear: Hearing, ear canal and external ear normal. A middle ear effusion is present. Tympanic membrane is not perforated or erythematous.     Left Ear: Hearing, ear canal and external ear normal. A middle ear effusion is present. Tympanic membrane is erythematous. Tympanic membrane is not perforated.     Nose: Congestion and rhinorrhea present. Rhinorrhea is purulent.     Right Turbinates: Enlarged and swollen.     Left Turbinates: Enlarged and swollen.     Right Sinus: Maxillary sinus tenderness present. No frontal sinus tenderness.     Left Sinus: Maxillary sinus tenderness present. No frontal sinus tenderness.     Mouth/Throat:     Lips: Pink.     Mouth: Mucous membranes are moist.     Pharynx: Uvula midline. Posterior oropharyngeal erythema present. No pharyngeal swelling, oropharyngeal exudate or uvula swelling.     Tonsils: No tonsillar exudate or tonsillar abscesses.  Eyes:     General: Lids are normal.     Conjunctiva/sclera: Conjunctivae normal.     Pupils: Pupils are equal, round, and reactive to light.  Neck:     Musculoskeletal: Neck supple.     Thyroid: No thyroid mass, thyromegaly or thyroid tenderness.  Cardiovascular:     Rate and Rhythm: Normal rate and regular rhythm.     Heart sounds: Normal heart sounds. No murmur. No friction rub. No gallop.   Pulmonary:     Effort: Pulmonary effort is normal. No respiratory distress.     Breath sounds: Normal breath sounds.  Lymphadenopathy:     Cervical: Cervical adenopathy (mild) present.  Skin:    General: Skin is warm and dry.     Capillary Refill: Capillary refill takes less than 2 seconds.   Neurological:     Mental Status: She is alert and oriented to person, place, and time.  Psychiatric:        Mood and Affect: Mood normal.        Behavior: Behavior normal. Behavior is cooperative.        Thought Content: Thought content normal.        Judgment: Judgment normal.     Results for orders placed or performed in visit on 07/05/16  Cytology - PAP  Result Value Ref Range   CYTOLOGY -  PAP PAP RESULT        Pertinent labs & imaging results that were available during my care of the patient were reviewed by me and considered in my medical decision making.  Assessment & Plan:  Fayetta was seen today for sinusitis.  Diagnoses and all orders for this visit:  Acute non-recurrent maxillary sinusitis Increase fluid intake. Avoid cigarette smoke. Flonase daily. Frequent saline nasal spray. Continue Zyrtec. Medications as prescribed.  -     amoxicillin-clavulanate (AUGMENTIN) 875-125 MG tablet; Take 1 tablet by mouth 2 (two) times daily for 7 days. -     fluticasone (FLONASE) 50 MCG/ACT nasal spray; Place 2 sprays into both nostrils daily.    Continue all other maintenance medications.  Follow up plan: Return if symptoms worsen or fail to improve.  Educational handout given for URI, sinusitis  The above assessment and management plan was discussed with the patient. The patient verbalized understanding of and has agreed to the management plan. Patient is aware to call the clinic if symptoms persist or worsen. Patient is aware when to return to the clinic for a follow-up visit. Patient educated on when it is appropriate to go to the emergency department.   Monia Pouch, FNP-C [facility] [phone]

## 2018-12-19 ENCOUNTER — Telehealth: Payer: Self-pay | Admitting: Family Medicine

## 2019-03-12 DIAGNOSIS — D2362 Other benign neoplasm of skin of left upper limb, including shoulder: Secondary | ICD-10-CM | POA: Diagnosis not present

## 2019-03-12 DIAGNOSIS — L821 Other seborrheic keratosis: Secondary | ICD-10-CM | POA: Diagnosis not present

## 2019-03-12 DIAGNOSIS — D2371 Other benign neoplasm of skin of right lower limb, including hip: Secondary | ICD-10-CM | POA: Diagnosis not present

## 2019-03-12 DIAGNOSIS — L812 Freckles: Secondary | ICD-10-CM | POA: Diagnosis not present

## 2019-04-09 DIAGNOSIS — D0472 Carcinoma in situ of skin of left lower limb, including hip: Secondary | ICD-10-CM | POA: Diagnosis not present

## 2019-06-20 ENCOUNTER — Other Ambulatory Visit: Payer: Self-pay | Admitting: Obstetrics and Gynecology

## 2019-06-20 DIAGNOSIS — Z1231 Encounter for screening mammogram for malignant neoplasm of breast: Secondary | ICD-10-CM

## 2019-08-06 DIAGNOSIS — Z6837 Body mass index (BMI) 37.0-37.9, adult: Secondary | ICD-10-CM | POA: Diagnosis not present

## 2019-08-06 DIAGNOSIS — Z124 Encounter for screening for malignant neoplasm of cervix: Secondary | ICD-10-CM | POA: Diagnosis not present

## 2019-08-06 DIAGNOSIS — Z01419 Encounter for gynecological examination (general) (routine) without abnormal findings: Secondary | ICD-10-CM | POA: Diagnosis not present

## 2019-08-14 ENCOUNTER — Ambulatory Visit
Admission: RE | Admit: 2019-08-14 | Discharge: 2019-08-14 | Disposition: A | Discharge status: Home / Self Care | Payer: BLUE CROSS/BLUE SHIELD | Source: Ambulatory Visit | Attending: Obstetrics and Gynecology | Admitting: Obstetrics and Gynecology

## 2019-08-14 ENCOUNTER — Other Ambulatory Visit: Payer: Self-pay

## 2019-08-14 DIAGNOSIS — Z1231 Encounter for screening mammogram for malignant neoplasm of breast: Secondary | ICD-10-CM

## 2019-10-08 DIAGNOSIS — Z85828 Personal history of other malignant neoplasm of skin: Secondary | ICD-10-CM | POA: Diagnosis not present

## 2019-10-08 DIAGNOSIS — L821 Other seborrheic keratosis: Secondary | ICD-10-CM | POA: Diagnosis not present

## 2019-10-08 DIAGNOSIS — L570 Actinic keratosis: Secondary | ICD-10-CM | POA: Diagnosis not present

## 2019-10-25 DIAGNOSIS — Z23 Encounter for immunization: Secondary | ICD-10-CM | POA: Diagnosis not present

## 2019-11-23 DIAGNOSIS — Z23 Encounter for immunization: Secondary | ICD-10-CM | POA: Diagnosis not present

## 2020-01-02 DIAGNOSIS — L570 Actinic keratosis: Secondary | ICD-10-CM | POA: Diagnosis not present

## 2020-01-02 DIAGNOSIS — L821 Other seborrheic keratosis: Secondary | ICD-10-CM | POA: Diagnosis not present

## 2020-01-02 DIAGNOSIS — L718 Other rosacea: Secondary | ICD-10-CM | POA: Diagnosis not present

## 2020-01-28 DIAGNOSIS — R143 Flatulence: Secondary | ICD-10-CM | POA: Diagnosis not present

## 2020-01-28 DIAGNOSIS — R140 Abdominal distension (gaseous): Secondary | ICD-10-CM | POA: Diagnosis not present

## 2020-01-28 DIAGNOSIS — Z86010 Personal history of colonic polyps: Secondary | ICD-10-CM | POA: Diagnosis not present

## 2020-03-10 DIAGNOSIS — D2271 Melanocytic nevi of right lower limb, including hip: Secondary | ICD-10-CM | POA: Diagnosis not present

## 2020-03-10 DIAGNOSIS — L918 Other hypertrophic disorders of the skin: Secondary | ICD-10-CM | POA: Diagnosis not present

## 2020-03-10 DIAGNOSIS — D225 Melanocytic nevi of trunk: Secondary | ICD-10-CM | POA: Diagnosis not present

## 2020-03-10 DIAGNOSIS — D2371 Other benign neoplasm of skin of right lower limb, including hip: Secondary | ICD-10-CM | POA: Diagnosis not present

## 2020-03-10 DIAGNOSIS — L570 Actinic keratosis: Secondary | ICD-10-CM | POA: Diagnosis not present

## 2020-03-10 DIAGNOSIS — L820 Inflamed seborrheic keratosis: Secondary | ICD-10-CM | POA: Diagnosis not present

## 2020-03-10 DIAGNOSIS — L821 Other seborrheic keratosis: Secondary | ICD-10-CM | POA: Diagnosis not present

## 2020-05-05 DIAGNOSIS — Z86010 Personal history of colonic polyps: Secondary | ICD-10-CM | POA: Diagnosis not present

## 2020-05-05 DIAGNOSIS — K219 Gastro-esophageal reflux disease without esophagitis: Secondary | ICD-10-CM | POA: Diagnosis not present

## 2020-06-09 DIAGNOSIS — L720 Epidermal cyst: Secondary | ICD-10-CM | POA: Diagnosis not present

## 2020-06-25 DIAGNOSIS — Z23 Encounter for immunization: Secondary | ICD-10-CM | POA: Diagnosis not present

## 2020-06-26 DIAGNOSIS — Z1159 Encounter for screening for other viral diseases: Secondary | ICD-10-CM | POA: Diagnosis not present

## 2020-07-01 ENCOUNTER — Other Ambulatory Visit: Payer: Self-pay | Admitting: Obstetrics and Gynecology

## 2020-07-01 ENCOUNTER — Other Ambulatory Visit: Payer: Self-pay | Admitting: Gastroenterology

## 2020-07-01 DIAGNOSIS — K648 Other hemorrhoids: Secondary | ICD-10-CM | POA: Diagnosis not present

## 2020-07-01 DIAGNOSIS — Z86010 Personal history of colonic polyps: Secondary | ICD-10-CM

## 2020-07-01 DIAGNOSIS — Z1231 Encounter for screening mammogram for malignant neoplasm of breast: Secondary | ICD-10-CM

## 2020-08-04 ENCOUNTER — Ambulatory Visit
Admission: RE | Admit: 2020-08-04 | Discharge: 2020-08-04 | Disposition: A | Discharge status: Home / Self Care | Payer: BC Managed Care – PPO | Source: Ambulatory Visit | Attending: Gastroenterology | Admitting: Gastroenterology

## 2020-08-04 DIAGNOSIS — Z86010 Personal history of colonic polyps: Secondary | ICD-10-CM

## 2020-08-15 ENCOUNTER — Other Ambulatory Visit: Payer: Self-pay

## 2020-08-15 ENCOUNTER — Ambulatory Visit
Admission: RE | Admit: 2020-08-15 | Discharge: 2020-08-15 | Disposition: A | Discharge status: Home / Self Care | Payer: BC Managed Care – PPO | Source: Ambulatory Visit | Attending: Obstetrics and Gynecology | Admitting: Obstetrics and Gynecology

## 2020-08-15 DIAGNOSIS — Z1231 Encounter for screening mammogram for malignant neoplasm of breast: Secondary | ICD-10-CM

## 2021-07-17 ENCOUNTER — Other Ambulatory Visit: Payer: Self-pay | Admitting: Family Medicine

## 2021-07-17 DIAGNOSIS — Z1231 Encounter for screening mammogram for malignant neoplasm of breast: Secondary | ICD-10-CM

## 2021-08-17 ENCOUNTER — Ambulatory Visit
Admission: RE | Admit: 2021-08-17 | Discharge: 2021-08-17 | Disposition: A | Discharge status: Home / Self Care | Payer: 59 | Source: Ambulatory Visit | Attending: Family Medicine | Admitting: Family Medicine

## 2021-08-17 DIAGNOSIS — Z1231 Encounter for screening mammogram for malignant neoplasm of breast: Secondary | ICD-10-CM

## 2021-08-24 DIAGNOSIS — Z01419 Encounter for gynecological examination (general) (routine) without abnormal findings: Secondary | ICD-10-CM | POA: Diagnosis not present

## 2021-08-24 DIAGNOSIS — B009 Herpesviral infection, unspecified: Secondary | ICD-10-CM | POA: Insufficient documentation

## 2021-09-23 DIAGNOSIS — L812 Freckles: Secondary | ICD-10-CM | POA: Diagnosis not present

## 2021-09-23 DIAGNOSIS — L820 Inflamed seborrheic keratosis: Secondary | ICD-10-CM | POA: Diagnosis not present

## 2021-09-23 DIAGNOSIS — I788 Other diseases of capillaries: Secondary | ICD-10-CM | POA: Diagnosis not present

## 2021-09-23 DIAGNOSIS — L821 Other seborrheic keratosis: Secondary | ICD-10-CM | POA: Diagnosis not present

## 2021-09-23 DIAGNOSIS — D045 Carcinoma in situ of skin of trunk: Secondary | ICD-10-CM | POA: Diagnosis not present

## 2022-03-22 DIAGNOSIS — D2271 Melanocytic nevi of right lower limb, including hip: Secondary | ICD-10-CM | POA: Diagnosis not present

## 2022-03-22 DIAGNOSIS — L720 Epidermal cyst: Secondary | ICD-10-CM | POA: Diagnosis not present

## 2022-03-22 DIAGNOSIS — L821 Other seborrheic keratosis: Secondary | ICD-10-CM | POA: Diagnosis not present

## 2022-03-22 DIAGNOSIS — L570 Actinic keratosis: Secondary | ICD-10-CM | POA: Diagnosis not present

## 2022-03-22 DIAGNOSIS — D2272 Melanocytic nevi of left lower limb, including hip: Secondary | ICD-10-CM | POA: Diagnosis not present

## 2022-03-22 DIAGNOSIS — D2372 Other benign neoplasm of skin of left lower limb, including hip: Secondary | ICD-10-CM | POA: Diagnosis not present

## 2022-03-22 DIAGNOSIS — D2371 Other benign neoplasm of skin of right lower limb, including hip: Secondary | ICD-10-CM | POA: Diagnosis not present

## 2022-03-22 DIAGNOSIS — L853 Xerosis cutis: Secondary | ICD-10-CM | POA: Diagnosis not present

## 2022-03-22 DIAGNOSIS — Z85828 Personal history of other malignant neoplasm of skin: Secondary | ICD-10-CM | POA: Diagnosis not present

## 2022-03-22 DIAGNOSIS — L820 Inflamed seborrheic keratosis: Secondary | ICD-10-CM | POA: Diagnosis not present

## 2022-03-22 DIAGNOSIS — L812 Freckles: Secondary | ICD-10-CM | POA: Diagnosis not present

## 2022-04-01 ENCOUNTER — Encounter: Payer: Self-pay | Admitting: Family Medicine

## 2022-04-01 ENCOUNTER — Ambulatory Visit: Payer: 59 | Admitting: Family Medicine

## 2022-04-01 VITALS — BP 147/73 | HR 68 | Temp 98.5°F | Ht 62.0 in | Wt 196.0 lb

## 2022-04-01 DIAGNOSIS — Z6835 Body mass index (BMI) 35.0-35.9, adult: Secondary | ICD-10-CM | POA: Diagnosis not present

## 2022-04-01 DIAGNOSIS — I10 Essential (primary) hypertension: Secondary | ICD-10-CM

## 2022-04-01 LAB — URINALYSIS
Bilirubin, UA: NEGATIVE
Glucose, UA: NEGATIVE
Ketones, UA: NEGATIVE
Leukocytes,UA: NEGATIVE
Nitrite, UA: NEGATIVE
Protein,UA: NEGATIVE
RBC, UA: NEGATIVE
Specific Gravity, UA: 1.02 (ref 1.005–1.030)
Urobilinogen, Ur: 0.2 mg/dL (ref 0.2–1.0)
pH, UA: 6 (ref 5.0–7.5)

## 2022-04-01 MED ORDER — CHLORTHALIDONE 25 MG PO TABS: 25.0000 mg | ORAL_TABLET | Freq: Every day | ORAL | 0 refills | Status: DC

## 2022-04-01 NOTE — Patient Instructions (Signed)

## 2022-04-01 NOTE — Progress Notes (Signed)
Subjective:  Patient ID: Sherri Walter, female    DOB: 1960-12-31, 61 y.o.   MRN: [id number]  Patient Care Team: Dettinger, Fransisca Kaufmann, MD as PCP - General (Family Medicine)   Chief Complaint:  Blood Pressure Check   HPI: Sherri Walter is a 61 y.o. female presenting on 04/01/2022 for Blood Pressure Check   Pt presents today to reestablish care and for the evaluation of elevated blood pressure readings and headaches. States over the last 6 weeks her blood pressure has been running over 150-160/70-60 on a regular basis. States she is on clonidine 0.1 mg daily for hot flashes and her BP is still elevated. She reports a headache with dizziness at times with the elevated blood pressure. Reports a significant family history of HTN and heart disease, father died from MI at age 57. She denies excessive caffeine or sodium intake. No contributing medications reported.      Relevant past medical, surgical, family, and social history reviewed and updated as indicated.  Allergies and medications reviewed and updated. Data reviewed: Chart in Epic.   Past Medical History:  Diagnosis Date   Dyslipidemia    Vertigo     Past Surgical History:  Procedure Laterality Date   BREAST BIOPSY Right    benign   TUBAL LIGATION      Social History   Socioeconomic History   Marital status: Married    Spouse name: Not on file   Number of children: Not on file   Years of education: Not on file   Highest education level: Not on file  Occupational History   Not on file  Tobacco Use   Smoking status: Never   Smokeless tobacco: Never  Vaping Use   Vaping Use: Never used  Substance and Sexual Activity   Alcohol use: Yes    Comment: some   Drug use: No   Sexual activity: Not on file  Other Topics Concern   Not on file  Social History Narrative   Not on file   Social Determinants of Health   Financial Resource Strain: Not on file  Food Insecurity: Not on file  Transportation  Needs: Not on file  Physical Activity: Not on file  Stress: Not on file  Social Connections: Not on file  Intimate Partner Violence: Not on file    Outpatient Encounter Medications as of 04/01/2022  Medication Sig   chlorthalidone (HYGROTON) 25 MG tablet Take 1 tablet (25 mg total) by mouth daily.   Cholecalciferol (VITAMIN D PO) Take by mouth.   cloNIDine (CATAPRES) 0.1 MG tablet Take 0.1 mg by mouth daily.   Omega-3 Fatty Acids (FISH OIL) 1000 MG CAPS Take by mouth.   [DISCONTINUED] allopurinol (ZYLOPRIM) 100 MG tablet Take 1 tablet (100 mg total) by mouth 3 (three) times daily.   [DISCONTINUED] Biotin 10000 MCG TABS Take by mouth.   [DISCONTINUED] fluticasone (FLONASE) 50 MCG/ACT nasal spray Place 2 sprays into both nostrils daily.   No facility-administered encounter medications on file as of 04/01/2022.    Allergies  Allergen Reactions   Codeine Other (See Comments)   Morphine And Related     Nausea,itching   Sulfa Antibiotics     Review of Systems  Constitutional:  Negative for activity change, appetite change, chills, diaphoresis, fatigue, fever and unexpected weight change.  HENT: Negative.    Eyes: Negative.   Respiratory:  Negative for cough, chest tightness and shortness of breath.   Cardiovascular:  Negative for chest pain,  palpitations and leg swelling.  Gastrointestinal:  Negative for abdominal pain, blood in stool, constipation, diarrhea, nausea and vomiting.  Endocrine: Negative.   Genitourinary:  Negative for decreased urine volume, difficulty urinating, dysuria, frequency and urgency.  Musculoskeletal:  Negative for arthralgias and myalgias.  Skin: Negative.   Allergic/Immunologic: Negative.   Neurological:  Positive for dizziness and headaches. Negative for tremors, seizures, syncope, facial asymmetry, speech difficulty, weakness, light-headedness and numbness.  Hematological: Negative.   Psychiatric/Behavioral:  Negative for confusion, hallucinations, sleep  disturbance and suicidal ideas.   All other systems reviewed and are negative.       Objective:  BP (!) 147/73   Pulse 68   Temp 98.5 F (36.9 C)   Ht _0  (1.575 m)   Wt 196 lb (88.9 kg)   LMP 06/28/2013   SpO2 99%   BMI 35.85 kg/m    Wt Readings from Last 3 Encounters:  04/01/22 196 lb (88.9 kg)  08/14/18 198 lb (89.8 kg)  03/18/18 200 lb (90.7 kg)    Physical Exam Vitals and nursing note reviewed.  Constitutional:      General: She is not in acute distress.    Appearance: Normal appearance. She is well-developed and well-groomed. She is obese. She is not ill-appearing, toxic-appearing or diaphoretic.  HENT:     Head: Normocephalic and atraumatic.     Jaw: There is normal jaw occlusion.     Right Ear: Hearing normal.     Left Ear: Hearing normal.     Nose: Nose normal.     Mouth/Throat:     Lips: Pink.     Mouth: Mucous membranes are moist.     Pharynx: Oropharynx is clear. Uvula midline.  Eyes:     General: Lids are normal.     Extraocular Movements: Extraocular movements intact.     Conjunctiva/sclera: Conjunctivae normal.     Pupils: Pupils are equal, round, and reactive to light.  Neck:     Thyroid: No thyroid mass, thyromegaly or thyroid tenderness.     Vascular: No carotid bruit or JVD.     Trachea: Trachea and phonation normal.  Cardiovascular:     Rate and Rhythm: Normal rate and regular rhythm.     Chest Wall: PMI is not displaced.     Pulses: Normal pulses.     Heart sounds: Normal heart sounds. No murmur heard.    No friction rub. No gallop.  Pulmonary:     Effort: Pulmonary effort is normal. No respiratory distress.     Breath sounds: Normal breath sounds. No wheezing.  Abdominal:     General: Bowel sounds are normal. There is no distension or abdominal bruit.     Palpations: Abdomen is soft. There is no hepatomegaly or splenomegaly.     Tenderness: There is no abdominal tenderness. There is no right CVA tenderness or left CVA tenderness.      Hernia: No hernia is present.  Musculoskeletal:        General: Normal range of motion.     Cervical back: Normal range of motion and neck supple.     Right lower leg: No edema.     Left lower leg: No edema.  Lymphadenopathy:     Cervical: No cervical adenopathy.  Skin:    General: Skin is warm and dry.     Capillary Refill: Capillary refill takes less than 2 seconds.     Coloration: Skin is not cyanotic, jaundiced or pale.     Findings:  No rash.  Neurological:     General: No focal deficit present.     Mental Status: She is alert and oriented to person, place, and time.     Sensory: Sensation is intact.     Motor: Motor function is intact.     Coordination: Coordination is intact.     Gait: Gait is intact.     Deep Tendon Reflexes: Reflexes are normal and symmetric.  Psychiatric:        Attention and Perception: Attention and perception normal.        Mood and Affect: Mood and affect normal.        Speech: Speech normal.        Behavior: Behavior normal. Behavior is cooperative.        Thought Content: Thought content normal.        Cognition and Memory: Cognition and memory normal.        Judgment: Judgment normal.     Results for orders placed or performed in visit on 07/05/16  Cytology - PAP  Result Value Ref Range   CYTOLOGY - PAP PAP RESULT      EKG: SR 69, PR 160 ms, QT 370 ms, no acute ST-T changes, no ectopy, incomplete BBB, Q waves V2. No prior EKG for comparison. Monia Pouch, FNP-C  Pertinent labs & imaging results that were available during my care of the patient were reviewed by me and considered in my medical decision making.  Assessment & Plan:  Tanija was seen today for blood pressure check.  Diagnoses and all orders for this visit:  Primary hypertension New diagnosis. EKG and labs completed today. DASH diet and exercise encouraged. Initiate medications as discussed. Report persistent high or low readings. Follow up in 3-4 weeks for BP and lab recheck.   -     CBC with Differential/Platelet -     CMP14+EGFR -     Lipid panel -     Thyroid Panel With TSH -     chlorthalidone (HYGROTON) 25 MG tablet; Take 1 tablet (25 mg total) by mouth daily. -     EKG 12-Lead -     Microalbumin / creatinine urine ratio -     Urinalysis  BMI 35.0-35.9,adult Diet and exercise encouraged. Labs pending.  -     CBC with Differential/Platelet -     CMP14+EGFR -     Lipid panel -     Thyroid Panel With TSH     Continue all other maintenance medications.  Follow up plan: Return in about 4 weeks (around 04/29/2022) for HTN.   Continue healthy lifestyle choices, including diet (rich in fruits, vegetables, and lean proteins, and low in salt and simple carbohydrates) and exercise (at least 30 minutes of moderate physical activity daily).  Educational handout given for DASH diet, HTN  The above assessment and management plan was discussed with the patient. The patient verbalized understanding of and has agreed to the management plan. Patient is aware to call the clinic if they develop any new symptoms or if symptoms persist or worsen. Patient is aware when to return to the clinic for a follow-up visit. Patient educated on when it is appropriate to go to the emergency department.   Monia Pouch, FNP-C [facility] [phone]

## 2022-04-02 LAB — THYROID PANEL WITH TSH
Free Thyroxine Index: 2 (ref 1.2–4.9)
T3 Uptake Ratio: 26 % (ref 24–39)
T4, Total: 7.5 ug/dL (ref 4.5–12.0)
TSH: 1.96 u[IU]/mL (ref 0.450–4.500)

## 2022-04-02 LAB — CBC WITH DIFFERENTIAL/PLATELET
Basophils Absolute: 0.1 10*3/uL (ref 0.0–0.2)
Basos: 1 %
EOS (ABSOLUTE): 0.1 10*3/uL (ref 0.0–0.4)
Eos: 1 %
Hematocrit: 46.4 % (ref 34.0–46.6)
Hemoglobin: 15.2 g/dL (ref 11.1–15.9)
Immature Grans (Abs): 0 10*3/uL (ref 0.0–0.1)
Immature Granulocytes: 0 %
Lymphocytes Absolute: 2.1 10*3/uL (ref 0.7–3.1)
Lymphs: 17 %
MCH: 29.1 pg (ref 26.6–33.0)
MCHC: 32.8 g/dL (ref 31.5–35.7)
MCV: 89 fL (ref 79–97)
Monocytes Absolute: 0.7 10*3/uL (ref 0.1–0.9)
Monocytes: 6 %
Neutrophils Absolute: 9.2 10*3/uL — ABNORMAL HIGH (ref 1.4–7.0)
Neutrophils: 75 %
Platelets: 211 10*3/uL (ref 150–450)
RBC: 5.22 x10E6/uL (ref 3.77–5.28)
RDW: 13.6 % (ref 11.7–15.4)
WBC: 12.2 10*3/uL — ABNORMAL HIGH (ref 3.4–10.8)

## 2022-04-02 LAB — MICROALBUMIN / CREATININE URINE RATIO
Creatinine, Urine: 109.2 mg/dL
Microalb/Creat Ratio: <3 mg/g creat (ref 0–29)
Microalbumin, Urine: <3 ug/mL

## 2022-04-02 LAB — LIPID PANEL
Chol/HDL Ratio: 5.5 ratio — ABNORMAL HIGH (ref 0.0–4.4)
Cholesterol, Total: 244 mg/dL — ABNORMAL HIGH (ref 100–199)
HDL: 44 mg/dL (ref >39)
LDL Chol Calc (NIH): 180 mg/dL — ABNORMAL HIGH (ref 0–99)
Triglycerides: 110 mg/dL (ref 0–149)
VLDL Cholesterol Cal: 20 mg/dL (ref 5–40)

## 2022-04-02 LAB — CMP14+EGFR
ALT: 20 IU/L (ref 0–32)
AST: 20 IU/L (ref 0–40)
Albumin/Globulin Ratio: 1.7 (ref 1.2–2.2)
Albumin: 4.5 g/dL (ref 3.8–4.9)
Alkaline Phosphatase: 100 IU/L (ref 44–121)
BUN/Creatinine Ratio: 21 (ref 9–23)
BUN: 21 mg/dL (ref 6–24)
Bilirubin Total: 0.5 mg/dL (ref 0.0–1.2)
CO2: 23 mmol/L (ref 20–29)
Calcium: 9.7 mg/dL (ref 8.7–10.2)
Chloride: 100 mmol/L (ref 96–106)
Creatinine, Ser: 0.99 mg/dL (ref 0.57–1.00)
Globulin, Total: 2.6 g/dL (ref 1.5–4.5)
Glucose: 100 mg/dL — ABNORMAL HIGH (ref 70–99)
Potassium: 5 mmol/L (ref 3.5–5.2)
Sodium: 138 mmol/L (ref 134–144)
Total Protein: 7.1 g/dL (ref 6.0–8.5)
eGFR: 66 mL/min/{1.73_m2} (ref >59)

## 2022-04-30 ENCOUNTER — Ambulatory Visit: Payer: 59 | Admitting: Family Medicine

## 2022-05-03 DIAGNOSIS — N920 Excessive and frequent menstruation with regular cycle: Secondary | ICD-10-CM | POA: Insufficient documentation

## 2022-05-03 DIAGNOSIS — D259 Leiomyoma of uterus, unspecified: Secondary | ICD-10-CM | POA: Insufficient documentation

## 2022-05-04 ENCOUNTER — Encounter: Payer: Self-pay | Admitting: Family Medicine

## 2022-05-04 ENCOUNTER — Ambulatory Visit (INDEPENDENT_AMBULATORY_CARE_PROVIDER_SITE_OTHER): Payer: 59 | Admitting: Family Medicine

## 2022-05-04 VITALS — BP 129/71 | HR 77 | Temp 98.5°F | Ht 62.0 in | Wt 194.0 lb

## 2022-05-04 DIAGNOSIS — I10 Essential (primary) hypertension: Secondary | ICD-10-CM

## 2022-05-04 LAB — BMP8+EGFR
BUN/Creatinine Ratio: 24 — ABNORMAL HIGH (ref 9–23)
Chloride: 100 mmol/L (ref 96–106)
Glucose: 141 mg/dL — ABNORMAL HIGH (ref 70–99)
Sodium: 140 mmol/L (ref 134–144)

## 2022-05-04 NOTE — Patient Instructions (Signed)

## 2022-05-04 NOTE — Progress Notes (Signed)
Subjective:  Patient ID: Sherri Walter, female    DOB: 08-28-1960, 61 y.o.   MRN: [id number]  Patient Care Team: Baruch Gouty, FNP as PCP - General (Family Medicine)   Chief Complaint:  Medical Management of Chronic Issues (Hypertension/)   HPI: Sherri Walter is a 61 y.o. female presenting on 05/04/2022 for Medical Management of Chronic Issues (Hypertension/)   1. Primary hypertension Pt was started on chlorthalidone and only took for 30 days. States she has went to the chiropractor and got adjusted. States this relieved her pain and she feels much better. Has been off of medications for over 2 weeks and BP has been very well controlled. Does not wish to restart unless necessary. No headaches, chest pain, leg swelling, shortness of breath, confusion, weakness, palpitations, or syncope.      Relevant past medical, surgical, family, and social history reviewed and updated as indicated.  Allergies and medications reviewed and updated. Data reviewed: Chart in Epic.   Past Medical History:  Diagnosis Date   Dyslipidemia    Vertigo     Past Surgical History:  Procedure Laterality Date   BREAST BIOPSY Right    benign   TUBAL LIGATION      Social History   Socioeconomic History   Marital status: Married    Spouse name: Not on file   Number of children: Not on file   Years of education: Not on file   Highest education level: Not on file  Occupational History   Not on file  Tobacco Use   Smoking status: Never   Smokeless tobacco: Never  Vaping Use   Vaping Use: Never used  Substance and Sexual Activity   Alcohol use: Yes    Comment: some   Drug use: No   Sexual activity: Not on file  Other Topics Concern   Not on file  Social History Narrative   Not on file   Social Determinants of Health   Financial Resource Strain: Not on file  Food Insecurity: Not on file  Transportation Needs: Not on file  Physical Activity: Not on file  Stress: Not on file   Social Connections: Not on file  Intimate Partner Violence: Not on file    Outpatient Encounter Medications as of 05/04/2022  Medication Sig   Cholecalciferol (VITAMIN D PO) Take by mouth.   cloNIDine (CATAPRES) 0.1 MG tablet Take 0.1 mg by mouth daily.   Omega-3 Fatty Acids (FISH OIL) 1000 MG CAPS Take by mouth.   [DISCONTINUED] chlorthalidone (HYGROTON) 25 MG tablet Take 1 tablet (25 mg total) by mouth daily.   No facility-administered encounter medications on file as of 05/04/2022.    Allergies  Allergen Reactions   Codeine Other (See Comments)   Morphine And Related     Nausea,itching   Sulfa Antibiotics     Review of Systems  Constitutional:  Negative for activity change, appetite change, chills, diaphoresis, fatigue, fever and unexpected weight change.  HENT: Negative.    Eyes: Negative.  Negative for photophobia and visual disturbance.  Respiratory:  Negative for cough, chest tightness and shortness of breath.   Cardiovascular:  Negative for chest pain, palpitations and leg swelling.  Gastrointestinal:  Negative for abdominal pain, blood in stool, constipation, diarrhea, nausea and vomiting.  Endocrine: Negative.  Negative for polydipsia, polyphagia and polyuria.  Genitourinary:  Negative for decreased urine volume, difficulty urinating, dysuria, frequency and urgency.  Musculoskeletal:  Negative for arthralgias and myalgias.  Skin: Negative.  Allergic/Immunologic: Negative.   Neurological:  Negative for dizziness, tremors, seizures, syncope, facial asymmetry, speech difficulty, weakness, light-headedness, numbness and headaches.  Hematological: Negative.   Psychiatric/Behavioral:  Negative for confusion, hallucinations, sleep disturbance and suicidal ideas.   All other systems reviewed and are negative.       Objective:  BP 129/71   Pulse 77   Temp 98.5 F (36.9 C)   Ht 5' 2"  (1.575 m)   Wt 194 lb (88 kg)   LMP 06/28/2013   SpO2 97%   BMI 35.48 kg/m     Wt Readings from Last 3 Encounters:  05/04/22 194 lb (88 kg)  04/01/22 196 lb (88.9 kg)  08/14/18 198 lb (89.8 kg)    Physical Exam Vitals and nursing note reviewed.  Constitutional:      General: She is not in acute distress.    Appearance: Normal appearance. She is well-developed and well-groomed. She is obese. She is not ill-appearing, toxic-appearing or diaphoretic.  HENT:     Head: Normocephalic and atraumatic.     Jaw: There is normal jaw occlusion.     Right Ear: Hearing normal.     Left Ear: Hearing normal.     Nose: Nose normal.     Mouth/Throat:     Lips: Pink.     Mouth: Mucous membranes are moist.     Pharynx: Uvula midline.  Eyes:     General: Lids are normal.     Pupils: Pupils are equal, round, and reactive to light.  Neck:     Thyroid: No thyroid mass, thyromegaly or thyroid tenderness.     Vascular: No carotid bruit or JVD.     Trachea: Trachea and phonation normal.  Cardiovascular:     Rate and Rhythm: Normal rate and regular rhythm.     Chest Wall: PMI is not displaced.     Pulses: Normal pulses.     Heart sounds: Normal heart sounds. No murmur heard.    No friction rub. No gallop.  Pulmonary:     Effort: Pulmonary effort is normal. No respiratory distress.     Breath sounds: Normal breath sounds. No wheezing.  Abdominal:     General: There is no abdominal bruit.     Palpations: Abdomen is soft. There is no hepatomegaly or splenomegaly.  Musculoskeletal:        General: Normal range of motion.     Cervical back: Normal range of motion and neck supple.     Right lower leg: No edema.     Left lower leg: No edema.  Lymphadenopathy:     Cervical: No cervical adenopathy.  Skin:    General: Skin is warm and dry.     Capillary Refill: Capillary refill takes less than 2 seconds.     Coloration: Skin is not cyanotic, jaundiced or pale.     Findings: No rash.  Neurological:     General: No focal deficit present.     Mental Status: She is alert and  oriented to person, place, and time.     Sensory: Sensation is intact.     Motor: Motor function is intact.     Coordination: Coordination is intact.     Gait: Gait is intact.     Deep Tendon Reflexes: Reflexes are normal and symmetric.  Psychiatric:        Attention and Perception: Attention and perception normal.        Mood and Affect: Mood and affect normal.  Speech: Speech normal.        Behavior: Behavior normal. Behavior is cooperative.        Thought Content: Thought content normal.        Cognition and Memory: Cognition and memory normal.        Judgment: Judgment normal.     Results for orders placed or performed in visit on 04/01/22  CBC with Differential/Platelet  Result Value Ref Range   WBC 12.2 (H) 3.4 - 10.8 x10E3/uL   RBC 5.22 3.77 - 5.28 x10E6/uL   Hemoglobin 15.2 11.1 - 15.9 g/dL   Hematocrit 46.4 34.0 - 46.6 %   MCV 89 79 - 97 fL   MCH 29.1 26.6 - 33.0 pg   MCHC 32.8 31.5 - 35.7 g/dL   RDW 13.6 11.7 - 15.4 %   Platelets 211 150 - 450 x10E3/uL   Neutrophils 75 Not Estab. %   Lymphs 17 Not Estab. %   Monocytes 6 Not Estab. %   Eos 1 Not Estab. %   Basos 1 Not Estab. %   Neutrophils Absolute 9.2 (H) 1.4 - 7.0 x10E3/uL   Lymphocytes Absolute 2.1 0.7 - 3.1 x10E3/uL   Monocytes Absolute 0.7 0.1 - 0.9 x10E3/uL   EOS (ABSOLUTE) 0.1 0.0 - 0.4 x10E3/uL   Basophils Absolute 0.1 0.0 - 0.2 x10E3/uL   Immature Granulocytes 0 Not Estab. %   Immature Grans (Abs) 0.0 0.0 - 0.1 x10E3/uL  CMP14+EGFR  Result Value Ref Range   Glucose 100 (H) 70 - 99 mg/dL   BUN 21 6 - 24 mg/dL   Creatinine, Ser 0.99 0.57 - 1.00 mg/dL   eGFR 66 >59 mL/min/1.73   BUN/Creatinine Ratio 21 9 - 23   Sodium 138 134 - 144 mmol/L   Potassium 5.0 3.5 - 5.2 mmol/L   Chloride 100 96 - 106 mmol/L   CO2 23 20 - 29 mmol/L   Calcium 9.7 8.7 - 10.2 mg/dL   Total Protein 7.1 6.0 - 8.5 g/dL   Albumin 4.5 3.8 - 4.9 g/dL   Globulin, Total 2.6 1.5 - 4.5 g/dL   Albumin/Globulin Ratio 1.7 1.2 -  2.2   Bilirubin Total 0.5 0.0 - 1.2 mg/dL   Alkaline Phosphatase 100 44 - 121 IU/L   AST 20 0 - 40 IU/L   ALT 20 0 - 32 IU/L  Lipid panel  Result Value Ref Range   Cholesterol, Total 244 (H) 100 - 199 mg/dL   Triglycerides 110 0 - 149 mg/dL   HDL 44 >39 mg/dL   VLDL Cholesterol Cal 20 5 - 40 mg/dL   LDL Chol Calc (NIH) 180 (H) 0 - 99 mg/dL   Chol/HDL Ratio 5.5 (H) 0.0 - 4.4 ratio  Thyroid Panel With TSH  Result Value Ref Range   TSH 1.960 0.450 - 4.500 uIU/mL   T4, Total 7.5 4.5 - 12.0 ug/dL   T3 Uptake Ratio 26 24 - 39 %   Free Thyroxine Index 2.0 1.2 - 4.9  Microalbumin / creatinine urine ratio  Result Value Ref Range   Creatinine, Urine 109.2 Not Estab. mg/dL   Microalbumin, Urine <3.0 Not Estab. ug/mL   Microalb/Creat Ratio <3 0 - 29 mg/g creat  Urinalysis  Result Value Ref Range   Specific Gravity, UA 1.020 1.005 - 1.030   pH, UA 6.0 5.0 - 7.5   Color, UA Yellow Yellow   Appearance Ur Clear Clear   Leukocytes,UA Negative Negative   Protein,UA Negative Negative/Trace   Glucose, UA  Negative Negative   Ketones, UA Negative Negative   RBC, UA Negative Negative   Bilirubin, UA Negative Negative   Urobilinogen, Ur 0.2 0.2 - 1.0 mg/dL   Nitrite, UA Negative Negative       Pertinent labs & imaging results that were available during my care of the patient were reviewed by me and considered in my medical decision making.  Assessment & Plan:  Jyla was seen today for medical management of chronic issues.  Diagnoses and all orders for this visit:  Primary hypertension BP is well controlled without medications. Does not wish to restart unless necessary. Will continue to monitor BP and restart medications if it remains above 140/90. DASH diet and exercise discussed in detail.  -     BMP8+EGFR     Continue all other maintenance medications.  Follow up plan: Return in about 6 months (around 11/02/2022), or if symptoms worsen or fail to improve, for CPE.   Continue  healthy lifestyle choices, including diet (rich in fruits, vegetables, and lean proteins, and low in salt and simple carbohydrates) and exercise (at least 30 minutes of moderate physical activity daily).  Educational handout given for DASH diet and HTN  The above assessment and management plan was discussed with the patient. The patient verbalized understanding of and has agreed to the management plan. Patient is aware to call the clinic if they develop any new symptoms or if symptoms persist or worsen. Patient is aware when to return to the clinic for a follow-up visit. Patient educated on when it is appropriate to go to the emergency department.   Monia Pouch, FNP-C [facility] [phone]

## 2022-05-05 LAB — BMP8+EGFR
BUN: 21 mg/dL (ref 6–24)
CO2: 24 mmol/L (ref 20–29)
Calcium: 9.2 mg/dL (ref 8.7–10.2)
Creatinine, Ser: 0.89 mg/dL (ref 0.57–1.00)
Potassium: 3.8 mmol/L (ref 3.5–5.2)
eGFR: 75 mL/min/{1.73_m2} (ref >59)

## 2022-07-20 ENCOUNTER — Other Ambulatory Visit: Payer: Self-pay | Admitting: Obstetrics

## 2022-07-20 DIAGNOSIS — Z1231 Encounter for screening mammogram for malignant neoplasm of breast: Secondary | ICD-10-CM

## 2022-08-30 DIAGNOSIS — Z01419 Encounter for gynecological examination (general) (routine) without abnormal findings: Secondary | ICD-10-CM | POA: Diagnosis not present

## 2022-09-13 ENCOUNTER — Ambulatory Visit
Admission: RE | Admit: 2022-09-13 | Discharge: 2022-09-13 | Disposition: A | Discharge status: Home / Self Care | Payer: 59 | Source: Ambulatory Visit | Attending: Obstetrics | Admitting: Obstetrics

## 2022-09-13 DIAGNOSIS — Z1231 Encounter for screening mammogram for malignant neoplasm of breast: Secondary | ICD-10-CM | POA: Diagnosis not present

## 2022-09-20 DIAGNOSIS — L438 Other lichen planus: Secondary | ICD-10-CM | POA: Diagnosis not present

## 2022-09-20 DIAGNOSIS — D1801 Hemangioma of skin and subcutaneous tissue: Secondary | ICD-10-CM | POA: Diagnosis not present

## 2022-09-20 DIAGNOSIS — L821 Other seborrheic keratosis: Secondary | ICD-10-CM | POA: Diagnosis not present

## 2022-09-20 DIAGNOSIS — L812 Freckles: Secondary | ICD-10-CM | POA: Diagnosis not present

## 2022-09-20 DIAGNOSIS — D225 Melanocytic nevi of trunk: Secondary | ICD-10-CM | POA: Diagnosis not present

## 2023-02-23 ENCOUNTER — Ambulatory Visit: Payer: 59 | Admitting: Family Medicine

## 2023-02-23 ENCOUNTER — Encounter: Payer: Self-pay | Admitting: Family Medicine

## 2023-02-23 VITALS — BP 135/73 | HR 78 | Temp 100.9°F | Ht 62.0 in | Wt 198.4 lb

## 2023-02-23 DIAGNOSIS — I10 Essential (primary) hypertension: Secondary | ICD-10-CM

## 2023-02-23 DIAGNOSIS — J0100 Acute maxillary sinusitis, unspecified: Secondary | ICD-10-CM

## 2023-02-23 MED ORDER — VALSARTAN 160 MG PO TABS: 160.0000 mg | ORAL_TABLET | Freq: Every day | ORAL | 1 refills | Status: DC

## 2023-02-23 MED ORDER — AMOXICILLIN-POT CLAVULANATE 875-125 MG PO TABS: 1.0000 | ORAL_TABLET | Freq: Two times a day (BID) | ORAL | 0 refills | Status: DC

## 2023-02-23 NOTE — Progress Notes (Signed)
Subjective:  Patient ID: Sherri Walter, female    DOB: 1961/02/28  Age: 62 y.o. MRN: [id number]  CC: Hypertension   HPI Sherri Walter presents for multiple elevated BP readings at local pharmacies. As high as 180's systolic.  Marland Kitchen Patient has no history of headache chest pain or shortness of breath or recent cough. Patient also denies symptoms of TIA such as focal numbness or weakness.     02/23/2023    3:37 PM 05/04/2022    8:21 AM 04/01/2022    9:36 AM  Depression screen PHQ 2/9  Decreased Interest 0 0 0  Down, Depressed, Hopeless 0 0 0  PHQ - 2 Score 0 0 0  Altered sleeping  0 0  Tired, decreased energy  0 1  Change in appetite  0 0  Feeling bad or failure about yourself   0 0  Trouble concentrating  0 0  Moving slowly or fidgety/restless  0 0  Suicidal thoughts  0 0  PHQ-9 Score  0 1  Difficult doing work/chores  Not difficult at all Not difficult at all    History Sherri Walter has a past medical history of Dyslipidemia and Vertigo.   Sherri Walter has a past surgical history that includes Tubal ligation and Breast biopsy (Right).   Sherri Walter family history includes Arthritis in Sherri Walter mother; Heart disease in Sherri Walter father; Hypertension in Sherri Walter father.Sherri Walter reports that Sherri Walter has never smoked. Sherri Walter has never used smokeless tobacco. Sherri Walter reports current alcohol use. Sherri Walter reports that Sherri Walter does not use drugs.    ROS Review of Systems  Constitutional: Negative.  Negative for activity change, appetite change, chills and fever.  HENT:  Positive for congestion, postnasal drip, rhinorrhea and sinus pressure. Negative for ear discharge, ear pain, hearing loss, nosebleeds, sneezing and trouble swallowing.   Eyes:  Negative for visual disturbance.  Respiratory:  Negative for chest tightness and shortness of breath.   Cardiovascular:  Negative for chest pain and palpitations.  Gastrointestinal:  Negative for abdominal pain.  Musculoskeletal:  Negative for arthralgias.  Skin:  Negative for rash.     Objective:  BP 135/73   Pulse 78   Temp (!) 100.9 F (38.3 C)   Ht 5\' 2"  (1.575 m)   Wt 198 lb 6.4 oz (90 kg)   LMP 06/28/2013   SpO2 97%   BMI 36.29 kg/m   BP Readings from Last 3 Encounters:  02/23/23 135/73  05/04/22 129/71  04/01/22 (!) 147/73    Wt Readings from Last 3 Encounters:  02/23/23 198 lb 6.4 oz (90 kg)  05/04/22 194 lb (88 kg)  04/01/22 196 lb (88.9 kg)     Physical Exam Constitutional:      General: Sherri Walter is not in acute distress.    Appearance: Sherri Walter is well-developed.  Cardiovascular:     Rate and Rhythm: Normal rate and regular rhythm.  Pulmonary:     Breath sounds: Normal breath sounds.  Musculoskeletal:        General: Normal range of motion.  Skin:    General: Skin is warm and dry.  Neurological:     Mental Status: Sherri Walter is alert and oriented to person, place, and time.       Assessment & Plan:   Sherri Walter was seen today for hypertension.  Diagnoses and all orders for this visit:  Primary hypertension  Acute maxillary sinusitis, recurrence not specified  Other orders -     amoxicillin-clavulanate (AUGMENTIN) 875-125 MG tablet; Take 1 tablet by mouth  2 (two) times daily. Take all of this medication -     valsartan (DIOVAN) 160 MG tablet; Take 1 tablet (160 mg total) by mouth daily. For blood pressure.       I am having Sherri Walter start on amoxicillin-clavulanate and valsartan. I am also having Sherri Walter maintain Sherri Walter Fish Oil, Cholecalciferol (VITAMIN D PO), and cloNIDine.  Allergies as of 02/23/2023       Reactions   Codeine Other (See Comments)   Morphine And Codeine    Nausea,itching   Sulfa Antibiotics         Medication List        Accurate as of February 23, 2023 10:06 PM. If you have any questions, ask your nurse or doctor.          amoxicillin-clavulanate 875-125 MG tablet Commonly known as: AUGMENTIN Take 1 tablet by mouth 2 (two) times daily. Take all of this medication Started by: Sherri Walter   cloNIDine  0.1 MG tablet Commonly known as: CATAPRES Take 0.1 mg by mouth daily.   Fish Oil 1000 MG Caps Take by mouth.   valsartan 160 MG tablet Commonly known as: DIOVAN Take 1 tablet (160 mg total) by mouth daily. For blood pressure. Started by: Sherri Walter   VITAMIN D PO Take by mouth.         Follow-up: Return in about 1 month (around 03/26/2023) for hypertension with PCP.  Mechele Claude, M.D.

## 2023-02-24 ENCOUNTER — Ambulatory Visit: Payer: 59

## 2023-03-09 ENCOUNTER — Ambulatory Visit: Payer: 59 | Admitting: Family Medicine

## 2023-03-16 ENCOUNTER — Encounter: Payer: Self-pay | Admitting: Family Medicine

## 2023-03-16 ENCOUNTER — Ambulatory Visit: Payer: 59 | Admitting: Family Medicine

## 2023-03-16 VITALS — BP 131/73 | HR 75 | Temp 97.3°F | Ht 62.0 in | Wt 200.8 lb

## 2023-03-16 DIAGNOSIS — E559 Vitamin D deficiency, unspecified: Secondary | ICD-10-CM | POA: Diagnosis not present

## 2023-03-16 DIAGNOSIS — Z6836 Body mass index (BMI) 36.0-36.9, adult: Secondary | ICD-10-CM

## 2023-03-16 DIAGNOSIS — Z6838 Body mass index (BMI) 38.0-38.9, adult: Secondary | ICD-10-CM | POA: Insufficient documentation

## 2023-03-16 DIAGNOSIS — I10 Essential (primary) hypertension: Secondary | ICD-10-CM

## 2023-03-16 DIAGNOSIS — J069 Acute upper respiratory infection, unspecified: Secondary | ICD-10-CM

## 2023-03-16 MED ORDER — AZITHROMYCIN 250 MG PO TABS: ORAL_TABLET | ORAL | 0 refills | Status: DC

## 2023-03-16 MED ORDER — FLUTICASONE PROPIONATE 50 MCG/ACT NA SUSP: 2.0000 | Freq: Every day | NASAL | 6 refills | Status: DC

## 2023-03-16 NOTE — Progress Notes (Signed)
Subjective:  Patient ID: Sherri Walter, female    DOB: Jan 15, 1961, 62 y.o.   MRN: [id number]  Patient Care Team: Sonny Masters, FNP as PCP - General (Family Medicine)   Chief Complaint:  Hypertension (1 month follow up ) and nasal drainage (X 3 weeks- Patient states it is waking her up during the night. )   HPI: Sherri Walter is a 62 y.o. female presenting on 03/16/2023 for Hypertension (1 month follow up ) and nasal drainage (X 3 weeks- Patient states it is waking her up during the night. )    1. Primary hypertension Was stared on Valsartan for hypertension management 3 weeks ago. She is tolerating well. BP running in the 110-130/70-80 range at home. No headaches, chest pain, leg swelling, weakness, confusion, visual changes, or syncope.   2. BMI 36.0-36.9,adult She has modified her diet and is more active. No regular exercise.   3. URI with cough and congestion Was treated 02/23/2023 and prescribed Augmentin, she was unable to complete the antibiotics due to nausea. She is still taking mucinex but reports continued cough, congestion, and postnasal drainage. She has not been using Flonase.   4. Vitamin D deficiency She is on over the counter repletion therapy. No recent fractures, no arthralgias or trouble walking.      Relevant past medical, surgical, family, and social history reviewed and updated as indicated.  Allergies and medications reviewed and updated. Data reviewed: Chart in Epic.   Past Medical History:  Diagnosis Date   Dyslipidemia    Vertigo     Past Surgical History:  Procedure Laterality Date   BREAST BIOPSY Right    benign   TUBAL LIGATION      Social History   Socioeconomic History   Marital status: Married    Spouse name: Not on file   Number of children: Not on file   Years of education: Not on file   Highest education level: Not on file  Occupational History   Not on file  Tobacco Use   Smoking status: Never   Smokeless  tobacco: Never  Vaping Use   Vaping status: Never Used  Substance and Sexual Activity   Alcohol use: Yes    Comment: some   Drug use: No   Sexual activity: Not on file  Other Topics Concern   Not on file  Social History Narrative   Not on file   Social Determinants of Health   Financial Resource Strain: Not on file  Food Insecurity: Not on file  Transportation Needs: Not on file  Physical Activity: Not on file  Stress: Not on file  Social Connections: Not on file  Intimate Partner Violence: Not on file    Outpatient Encounter Medications as of 03/16/2023  Medication Sig   azithromycin (ZITHROMAX Z-PAK) 250 MG tablet As directed   Cholecalciferol (VITAMIN D PO) Take by mouth.   cloNIDine (CATAPRES) 0.1 MG tablet Take 0.1 mg by mouth daily.   fluticasone (FLONASE) 50 MCG/ACT nasal spray Place 2 sprays into both nostrils daily.   Omega-3 Fatty Acids (FISH OIL) 1000 MG CAPS Take by mouth.   valsartan (DIOVAN) 160 MG tablet Take 1 tablet (160 mg total) by mouth daily. For blood pressure.   [DISCONTINUED] amoxicillin-clavulanate (AUGMENTIN) 875-125 MG tablet Take 1 tablet by mouth 2 (two) times daily. Take all of this medication   No facility-administered encounter medications on file as of 03/16/2023.    Allergies  Allergen Reactions  Augmentin [Amoxicillin-Pot Clavulanate] Diarrhea   Codeine Other (See Comments)   Morphine And Codeine     Nausea,itching   Sulfa Antibiotics     Review of Systems  Constitutional:  Negative for activity change, appetite change, chills, diaphoresis, fatigue, fever and unexpected weight change.  HENT:  Positive for congestion, postnasal drip and rhinorrhea. Negative for dental problem, drooling, ear discharge, ear pain, facial swelling, hearing loss, mouth sores, nosebleeds, sinus pressure, sinus pain, sneezing, sore throat, tinnitus, trouble swallowing and voice change.   Eyes: Negative.  Negative for photophobia and visual disturbance.   Respiratory:  Positive for cough. Negative for chest tightness and shortness of breath.   Cardiovascular:  Negative for chest pain, palpitations and leg swelling.  Gastrointestinal:  Negative for abdominal pain, blood in stool, constipation, diarrhea, nausea and vomiting.  Endocrine: Negative.  Negative for polydipsia, polyphagia and polyuria.  Genitourinary:  Negative for decreased urine volume, difficulty urinating, dysuria, frequency and urgency.  Musculoskeletal:  Negative for arthralgias and myalgias.  Skin: Negative.   Allergic/Immunologic: Negative.   Neurological:  Negative for dizziness, tremors, seizures, syncope, facial asymmetry, speech difficulty, weakness, light-headedness, numbness and headaches.  Hematological: Negative.   Psychiatric/Behavioral:  Negative for confusion, hallucinations, sleep disturbance and suicidal ideas.   All other systems reviewed and are negative.       Objective:  BP 131/73   Pulse 75   Temp (!) 97.3 F (36.3 C) (Temporal)   Ht 5\' 2"  (1.575 m)   Wt 200 lb 12.8 oz (91.1 kg)   LMP 06/28/2013   SpO2 98%   BMI 36.73 kg/m    Wt Readings from Last 3 Encounters:  03/16/23 200 lb 12.8 oz (91.1 kg)  02/23/23 198 lb 6.4 oz (90 kg)  05/04/22 194 lb (88 kg)    Physical Exam Vitals and nursing note reviewed.  Constitutional:      General: She is not in acute distress.    Appearance: Normal appearance. She is well-developed and well-groomed. She is obese. She is not ill-appearing, toxic-appearing or diaphoretic.  HENT:     Head: Normocephalic and atraumatic.     Jaw: There is normal jaw occlusion.     Right Ear: Hearing, ear canal and external ear normal. A middle ear effusion is present. Tympanic membrane is not erythematous.     Left Ear: Hearing, ear canal and external ear normal. A middle ear effusion is present. Tympanic membrane is not erythematous.     Nose: Rhinorrhea present. No congestion.     Right Turbinates: Enlarged.     Left  Turbinates: Enlarged.     Right Sinus: No maxillary sinus tenderness or frontal sinus tenderness.     Left Sinus: No maxillary sinus tenderness or frontal sinus tenderness.     Mouth/Throat:     Lips: Pink.     Mouth: Mucous membranes are moist.     Pharynx: Oropharynx is clear. Uvula midline. Posterior oropharyngeal erythema and postnasal drip present. No pharyngeal swelling, oropharyngeal exudate or uvula swelling.  Eyes:     General: Lids are normal.     Extraocular Movements: Extraocular movements intact.     Conjunctiva/sclera: Conjunctivae normal.     Pupils: Pupils are equal, round, and reactive to light.  Neck:     Thyroid: No thyroid mass, thyromegaly or thyroid tenderness.     Vascular: No carotid bruit or JVD.     Trachea: Trachea and phonation normal.  Cardiovascular:     Rate and Rhythm: Normal rate and  regular rhythm.     Chest Wall: PMI is not displaced.     Pulses: Normal pulses.     Heart sounds: Normal heart sounds. No murmur heard.    No friction rub. No gallop.  Pulmonary:     Effort: Pulmonary effort is normal. No respiratory distress.     Breath sounds: Normal breath sounds. No wheezing.  Abdominal:     General: Bowel sounds are normal. There is no distension or abdominal bruit.     Palpations: Abdomen is soft. There is no hepatomegaly or splenomegaly.     Tenderness: There is no abdominal tenderness. There is no right CVA tenderness or left CVA tenderness.     Hernia: No hernia is present.  Musculoskeletal:        General: Normal range of motion.     Cervical back: Normal range of motion and neck supple.     Right lower leg: No edema.     Left lower leg: No edema.  Lymphadenopathy:     Cervical: No cervical adenopathy.  Skin:    General: Skin is warm and dry.     Capillary Refill: Capillary refill takes less than 2 seconds.     Coloration: Skin is not cyanotic, jaundiced or pale.     Findings: No rash.  Neurological:     General: No focal deficit  present.     Mental Status: She is alert and oriented to person, place, and time.     Sensory: Sensation is intact.     Motor: Motor function is intact.     Coordination: Coordination is intact.     Gait: Gait is intact.     Deep Tendon Reflexes: Reflexes are normal and symmetric.  Psychiatric:        Attention and Perception: Attention and perception normal.        Mood and Affect: Mood and affect normal.        Speech: Speech normal.        Behavior: Behavior normal. Behavior is cooperative.        Thought Content: Thought content normal.        Cognition and Memory: Cognition and memory normal.        Judgment: Judgment normal.     Results for orders placed or performed in visit on 05/04/22  BMP8+EGFR  Result Value Ref Range   Glucose 141 (H) 70 - 99 mg/dL   BUN 21 6 - 24 mg/dL   Creatinine, Ser 8.29 0.57 - 1.00 mg/dL   eGFR 75 >56 OZ/HYQ/6.57   BUN/Creatinine Ratio 24 (H) 9 - 23   Sodium 140 134 - 144 mmol/L   Potassium 3.8 3.5 - 5.2 mmol/L   Chloride 100 96 - 106 mmol/L   CO2 24 20 - 29 mmol/L   Calcium 9.2 8.7 - 10.2 mg/dL       Pertinent labs & imaging results that were available during my care of the patient were reviewed by me and considered in my medical decision making.  Assessment & Plan:  Phallyn was seen today for hypertension and nasal drainage.  Diagnoses and all orders for this visit:  Primary hypertension BP well controlled. Changes were not made in regimen today. Goal BP is 130/80. Pt aware to report any persistent high or low readings. DASH diet and exercise encouraged. Exercise at least 150 minutes per week and increase as tolerated. Goal BMI > 25. Stress management encouraged. Avoid nicotine and tobacco product use. Avoid excessive alcohol and  NSAID's. Avoid more than 2000 mg of sodium daily. Medications as prescribed. Follow up as scheduled.  -     CMP14+EGFR -     CBC with Differential/Platelet -     Lipid panel -     Thyroid Panel With TSH  BMI  36.0-36.9,adult Diet and exercise encouraged. Labs pending.  -     CMP14+EGFR -     CBC with Differential/Platelet -     Lipid panel -     Thyroid Panel With TSH  URI with cough and congestion Has not resolved. Will add flonase and Zithromax to regimen. Report new, worsening, or persistent symptoms.  -     fluticasone (FLONASE) 50 MCG/ACT nasal spray; Place 2 sprays into both nostrils daily. -     azithromycin (ZITHROMAX Z-PAK) 250 MG tablet; As directed  Vitamin D deficiency Labs pending. Continue repletion therapy. If indicated, will change repletion dosage. Eat foods rich in Vit D including milk, orange juice, yogurt with vitamin D added, salmon or mackerel, canned tuna fish, cereals with vitamin D added, and cod liver oil. Get out in the sun but make sure to wear at least SPF 30 sunscreen.  -     VITAMIN D 25 Hydroxy (Vit-D Deficiency, Fractures)     Continue all other maintenance medications.  Follow up plan: Return in about 3 months (around 06/16/2023) for CPE.   Continue healthy lifestyle choices, including diet (rich in fruits, vegetables, and lean proteins, and low in salt and simple carbohydrates) and exercise (at least 30 minutes of moderate physical activity daily).  Educational handout given for DASH diet, HTN  The above assessment and management plan was discussed with the patient. The patient verbalized understanding of and has agreed to the management plan. Patient is aware to call the clinic if they develop any new symptoms or if symptoms persist or worsen. Patient is aware when to return to the clinic for a follow-up visit. Patient educated on when it is appropriate to go to the emergency department.   Kari Baars, FNP-C [facility] [phone]

## 2023-03-16 NOTE — Patient Instructions (Signed)

## 2023-05-04 DIAGNOSIS — C44629 Squamous cell carcinoma of skin of left upper limb, including shoulder: Secondary | ICD-10-CM | POA: Diagnosis not present

## 2023-06-06 ENCOUNTER — Encounter: Payer: Self-pay | Admitting: *Deleted

## 2023-06-15 ENCOUNTER — Other Ambulatory Visit: Payer: Self-pay | Admitting: Obstetrics

## 2023-06-15 DIAGNOSIS — Z1231 Encounter for screening mammogram for malignant neoplasm of breast: Secondary | ICD-10-CM

## 2023-06-21 ENCOUNTER — Encounter: Payer: 59 | Admitting: Family Medicine

## 2023-06-23 ENCOUNTER — Ambulatory Visit (INDEPENDENT_AMBULATORY_CARE_PROVIDER_SITE_OTHER): Payer: 59 | Admitting: Family Medicine

## 2023-06-23 ENCOUNTER — Encounter: Payer: Self-pay | Admitting: Family Medicine

## 2023-06-23 VITALS — BP 135/65 | HR 81 | Temp 97.0°F | Resp 20 | Ht 62.0 in | Wt 200.4 lb

## 2023-06-23 DIAGNOSIS — Z0000 Encounter for general adult medical examination without abnormal findings: Secondary | ICD-10-CM | POA: Diagnosis not present

## 2023-06-23 DIAGNOSIS — Z6836 Body mass index (BMI) 36.0-36.9, adult: Secondary | ICD-10-CM

## 2023-06-23 DIAGNOSIS — Z0001 Encounter for general adult medical examination with abnormal findings: Secondary | ICD-10-CM | POA: Diagnosis not present

## 2023-06-23 DIAGNOSIS — B009 Herpesviral infection, unspecified: Secondary | ICD-10-CM

## 2023-06-23 DIAGNOSIS — I10 Essential (primary) hypertension: Secondary | ICD-10-CM | POA: Diagnosis not present

## 2023-06-23 MED ORDER — VALACYCLOVIR HCL 1 G PO TABS
1000.0000 mg | ORAL_TABLET | Freq: Two times a day (BID) | ORAL | 3 refills | Status: AC
Start: 2023-06-23 — End: 2023-07-03

## 2023-06-23 NOTE — Progress Notes (Signed)
Complete physical exam  Patient: Sherri Walter   DOB: September 29, 1960   62 y.o. Female  MRN: [id number]  Subjective:    Chief Complaint  Patient presents with   Annual Exam    Sherri Walter is a 62 y.o. female who presents today for a complete physical exam. She reports consuming a  healthy  diet. The patient has a physically strenuous job, but has no regular exercise apart from work.  She generally feels well. She reports sleeping well. She does not have additional problems to discuss today.   Discussed the use of AI scribe software for clinical note transcription with the patient, who gave verbal consent to proceed.  History of Present Illness   The patient, with a history of hypertension, presented for a routine physical examination. She reported no new health issues since the last visit. She recently received a COVID-19 booster shot and has been adhering to her prescribed medication regimen, which includes Valsartan and Clonidine.  The patient mentioned a recent episode of a fever blister, for which she borrowed Valacyclovir from a neighbor. She expressed a need for a prescription for this medication, as she had previously been prescribed it by her gynecologist due to stress-related outbreaks.  She has been regularly visiting a chiropractor and receiving massages for overall wellness.  The patient reported changes in bowel movements since her last colonoscopy, but no significant concerns. She also mentioned occasional bladder issues, which she plans to address with a urologist in the near future.  The patient is following a low-carb diet similar to her spouse's, but expressed frustration over not experiencing the same weight loss. She acknowledged the role of heredity in weight and overall health.  The patient is up-to-date with her shingles vaccination and plans to get a pneumonia vaccine after turning 67. She denied any leg swelling, chest pain, or shortness of breath.        Most recent fall risk assessment:    06/23/2023    3:17 PM  Fall Risk   Falls in the past year? 0  Follow up Falls evaluation completed     Most recent depression screenings:    06/23/2023    3:17 PM 03/16/2023   12:16 PM  PHQ 2/9 Scores  PHQ - 2 Score 0 0  PHQ- 9 Score 0 2    Vision:Within last year and Dental: No current dental problems and Receives regular dental care  Patient Active Problem List   Diagnosis Date Noted   Primary hypertension 03/16/2023   BMI 36.0-36.9,adult 03/16/2023   Uterine leiomyoma 05/03/2022   Herpesvirus infection 08/24/2021   Past Medical History:  Diagnosis Date   Dyslipidemia    Vertigo    Past Surgical History:  Procedure Laterality Date   BREAST BIOPSY Right    benign   TUBAL LIGATION     Social History   Tobacco Use   Smoking status: Never   Smokeless tobacco: Never  Vaping Use   Vaping status: Never Used  Substance Use Topics   Alcohol use: Yes    Comment: some   Drug use: No   Social History   Socioeconomic History   Marital status: Married    Spouse name: Not on file   Number of children: Not on file   Years of education: Not on file   Highest education level: Associate degree: occupational, Scientist, product/process development, or vocational program  Occupational History   Not on file  Tobacco Use   Smoking status: Never  Smokeless tobacco: Never  Vaping Use   Vaping status: Never Used  Substance and Sexual Activity   Alcohol use: Yes    Comment: some   Drug use: No   Sexual activity: Not on file  Other Topics Concern   Not on file  Social History Narrative   Not on file   Social Determinants of Health   Financial Resource Strain: Not on file  Food Insecurity: No Food Insecurity (06/22/2023)   Hunger Vital Sign    Worried About Running Out of Food in the Last Year: Never true    Ran Out of Food in the Last Year: Never true  Transportation Needs: No Transportation Needs (06/22/2023)   PRAPARE - Doctor, general practice (Medical): No    Lack of Transportation (Non-Medical): No  Physical Activity: Insufficiently Active (06/22/2023)   Exercise Vital Sign    Days of Exercise per Week: 2 days    Minutes of Exercise per Session: 20 min  Stress: No Stress Concern Present (06/22/2023)   [employer] of Occupational Health - Occupational Stress Questionnaire    Feeling of Stress : Not at all  Social Connections: Unknown (06/22/2023)   Social Connection and Isolation Panel [NHANES]    Frequency of Communication with Friends and Family: More than three times a week    Frequency of Social Gatherings with Friends and Family: Once a week    Attends Religious Services: More than 4 times per year    Active Member of [employer] or Organizations: Not on file    Attends Banker Meetings: Not on file    Marital Status: Married  Catering manager Violence: Not on file   Family Status  Relation Name Status   Mother  Deceased   Father  Deceased   Sister  Alive   Brother  Deceased   Neg Hx  (Not Specified)  No partnership data on file   Family History  Problem Relation Age of Onset   Arthritis Mother        DDD,SPINAL STENOSIS   Heart disease Father        Heart attack at age 36   Hypertension Father    Hypertension Sister    Hyperlipidemia Sister    Arthritis Sister    Thyroid disease Sister    Alcohol abuse Brother    Breast cancer Neg Hx    Allergies  Allergen Reactions   Augmentin [Amoxicillin-Pot Clavulanate] Diarrhea   Codeine Other (See Comments)   Morphine And Codeine     Nausea,itching   Sulfa Antibiotics       Patient Care Team: Sonny Masters, FNP as PCP - General (Family Medicine)   Outpatient Medications Prior to Visit  Medication Sig   Cholecalciferol (VITAMIN D PO) Take by mouth.   cloNIDine (CATAPRES) 0.1 MG tablet Take 0.1 mg by mouth daily.   valsartan (DIOVAN) 160 MG tablet Take 1 tablet (160 mg total) by mouth daily. For blood pressure.    [DISCONTINUED] fluticasone (FLONASE) 50 MCG/ACT nasal spray Place 2 sprays into both nostrils daily. (Patient taking differently: Place 2 sprays into both nostrils daily as needed.)   [DISCONTINUED] azithromycin (ZITHROMAX Z-PAK) 250 MG tablet As directed   [DISCONTINUED] Omega-3 Fatty Acids (FISH OIL) 1000 MG CAPS Take by mouth. (Patient not taking: Reported on 06/23/2023)   No facility-administered medications prior to visit.   ROS per HPI, otherwise unremarkable      Objective:     BP 135/65  Pulse 81   Temp (!) 97 F (36.1 C) (Oral)   Resp 20   Ht 5\' 2"  (1.575 m)   Wt 200 lb 6 oz (90.9 kg)   LMP 06/28/2013   SpO2 98%   BMI 36.65 kg/m  BP Readings from Last 3 Encounters:  06/23/23 135/65  03/16/23 131/73  02/23/23 135/73   Wt Readings from Last 3 Encounters:  06/23/23 200 lb 6 oz (90.9 kg)  03/16/23 200 lb 12.8 oz (91.1 kg)  02/23/23 198 lb 6.4 oz (90 kg)   SpO2 Readings from Last 3 Encounters:  06/23/23 98%  03/16/23 98%  02/23/23 97%      Physical Exam Vitals and nursing note reviewed.  Constitutional:      General: She is not in acute distress.    Appearance: Normal appearance. She is well-developed and well-groomed. She is obese. She is not ill-appearing, toxic-appearing or diaphoretic.  HENT:     Head: Normocephalic and atraumatic.     Jaw: There is normal jaw occlusion.     Right Ear: Hearing, tympanic membrane, ear canal and external ear normal.     Left Ear: Hearing, tympanic membrane, ear canal and external ear normal.     Nose: Nose normal.     Mouth/Throat:     Lips: Pink.     Mouth: Mucous membranes are moist.     Pharynx: Oropharynx is clear. Uvula midline.  Eyes:     General: Lids are normal.     Extraocular Movements: Extraocular movements intact.     Conjunctiva/sclera: Conjunctivae normal.     Pupils: Pupils are equal, round, and reactive to light.  Neck:     Thyroid: No thyroid mass, thyromegaly or thyroid tenderness.     Vascular:  No carotid bruit or JVD.     Trachea: Trachea and phonation normal.  Cardiovascular:     Rate and Rhythm: Normal rate and regular rhythm.     Chest Wall: PMI is not displaced.     Pulses: Normal pulses.     Heart sounds: Normal heart sounds. No murmur heard.    No friction rub. No gallop.  Pulmonary:     Effort: Pulmonary effort is normal. No respiratory distress.     Breath sounds: Normal breath sounds. No wheezing.  Abdominal:     General: Bowel sounds are normal. There is no distension or abdominal bruit.     Palpations: Abdomen is soft. There is no hepatomegaly or splenomegaly.     Tenderness: There is no abdominal tenderness. There is no right CVA tenderness or left CVA tenderness.     Hernia: No hernia is present.  Musculoskeletal:        General: Normal range of motion.     Cervical back: Normal range of motion and neck supple.     Right lower leg: No edema.     Left lower leg: No edema.  Lymphadenopathy:     Cervical: No cervical adenopathy.  Skin:    General: Skin is warm and dry.     Capillary Refill: Capillary refill takes less than 2 seconds.     Coloration: Skin is not cyanotic, jaundiced or pale.     Findings: No rash.  Neurological:     General: No focal deficit present.     Mental Status: She is alert and oriented to person, place, and time.     Sensory: Sensation is intact.     Motor: Motor function is intact.     Coordination:  Coordination is intact.     Gait: Gait is intact.     Deep Tendon Reflexes: Reflexes are normal and symmetric.  Psychiatric:        Attention and Perception: Attention and perception normal.        Mood and Affect: Mood and affect normal.        Speech: Speech normal.        Behavior: Behavior normal. Behavior is cooperative.        Thought Content: Thought content normal.        Cognition and Memory: Cognition and memory normal.        Judgment: Judgment normal.      Last CBC Lab Results  Component Value Date   WBC 10.8  03/16/2023   HGB 14.0 03/16/2023   HCT 42.8 03/16/2023   MCV 88 03/16/2023   MCH 28.8 03/16/2023   RDW 13.4 03/16/2023   PLT 195 03/16/2023   Last metabolic panel Lab Results  Component Value Date   GLUCOSE 82 03/16/2023   NA 141 03/16/2023   K 4.4 03/16/2023   CL 102 03/16/2023   CO2 23 03/16/2023   BUN 20 03/16/2023   CREATININE 0.88 03/16/2023   EGFR 75 03/16/2023   CALCIUM 9.0 03/16/2023   PROT 7.0 03/16/2023   ALBUMIN 4.3 03/16/2023   LABGLOB 2.7 03/16/2023   AGRATIO 1.7 04/01/2022   BILITOT 0.4 03/16/2023   ALKPHOS 99 03/16/2023   AST 23 03/16/2023   ALT 24 03/16/2023   Last lipids Lab Results  Component Value Date   CHOL 237 (H) 03/16/2023   HDL 39 (L) 03/16/2023   LDLCALC 169 (H) 03/16/2023   TRIG 156 (H) 03/16/2023   CHOLHDL 6.1 (H) 03/16/2023    Last thyroid functions Lab Results  Component Value Date   TSH 2.510 03/16/2023   T4TOTAL 8.1 03/16/2023   Last vitamin D Lab Results  Component Value Date   VD25OH 48.4 03/16/2023         Assessment & Plan:    Routine Health Maintenance and Physical Exam  Immunization History  Administered Date(s) Administered   Moderna Covid-19 Fall Seasonal Vaccine 82yrs & older 06/13/2023   Moderna Covid-19 Vaccine Bivalent Booster 56yrs & up 06/23/2021   Moderna Sars-Covid-2 Vaccination 11/23/2019, 01/27/2021   Tdap 02/21/2016   Zoster Recombinant(Shingrix) 09/09/2020, 12/07/2020    Health Maintenance  Topic Date Due   HIV Screening  Never done   Hepatitis C Screening  Never done   Cervical Cancer Screening (HPV/Pap Cotest)  07/06/2019   INFLUENZA VACCINE  11/07/2023 (Originally 03/10/2023)   MAMMOGRAM  09/13/2024   DTaP/Tdap/Td (2 - Td or Tdap) 02/20/2026   Colonoscopy  07/01/2030   COVID-19 Vaccine  Completed   Zoster Vaccines- Shingrix  Completed   HPV VACCINES  Aged Out    Discussed health benefits of physical activity, and encouraged her to engage in regular exercise appropriate for her age and  condition.  Problem List Items Addressed This Visit       Cardiovascular and Mediastinum   Primary hypertension   Relevant Orders   CMP14+EGFR   CBC with Differential/Platelet   Lipid panel   Thyroid Panel With TSH     Other   Herpesvirus infection   Relevant Medications   valACYclovir (VALTREX) 1000 MG tablet   BMI 36.0-36.9,adult   Relevant Orders   CMP14+EGFR   CBC with Differential/Platelet   Lipid panel   Thyroid Panel With TSH   VITAMIN D 25 Hydroxy (Vit-D  Deficiency, Fractures)   Other Visit Diagnoses     Annual physical exam    -  Primary   Relevant Orders   CMP14+EGFR   CBC with Differential/Platelet   Lipid panel   Thyroid Panel With TSH   VITAMIN D 25 Hydroxy (Vit-D Deficiency, Fractures)   HIV Antibody (routine testing w rflx)   Hepatitis C antibody      Assessment and Plan    Bladder Issues Intermittent bladder issues, not yet evaluated by a urologist. Discussed potential outcomes and the importance of early evaluation to prevent complications. - Schedule urology appointment for early next year  Hypertension Blood pressure well-controlled with valsartan and clonidine. Discussed benefits of current regimen and importance of regular monitoring. - Continue valsartan - Continue clonidine  Herpes  Recurrent cold sores, currently managed with valacyclovir. Borrowed medication from a neighbor due to lack of prescription. Discussed importance of personal prescription to avoid misuse and ensure proper dosage. - Send prescription for valacyclovir  General Health Maintenance Routine physical examination. Received COVID booster two weeks ago at [facility]. Mammogram scheduled for early next year. Pap smear appointment pending for January. No recent Hepatitis C or HIV screening noted. Regular dental check-ups and chiropractic visits maintained. Needs to schedule an eye exam. - Order comprehensive blood work - Schedule Pap smear for January - Ensure  mammogram is completed as scheduled - Order Hepatitis C and HIV screening - Schedule eye exam  Follow-up - Follow up with primary care physician after Pap smear and mammogram results - Ensure all lab results are reviewed and updated in the record.       Return in about 6 months (around 12/21/2023) for HTN, chronic follow up.     Kari Baars, FNP

## 2023-06-24 LAB — CBC WITH DIFFERENTIAL/PLATELET
Basophils Absolute: 0.1 10*3/uL (ref 0.0–0.2)
Basos: 1 %
EOS (ABSOLUTE): 0.1 10*3/uL (ref 0.0–0.4)
Eos: 1 %
Hematocrit: 42.8 % (ref 34.0–46.6)
Hemoglobin: 13.9 g/dL (ref 11.1–15.9)
Immature Grans (Abs): 0 10*3/uL (ref 0.0–0.1)
Immature Granulocytes: 0 %
Lymphocytes Absolute: 3.3 10*3/uL — ABNORMAL HIGH (ref 0.7–3.1)
Lymphs: 38 %
MCH: 29.4 pg (ref 26.6–33.0)
MCHC: 32.5 g/dL (ref 31.5–35.7)
MCV: 91 fL (ref 79–97)
Monocytes Absolute: 0.6 10*3/uL (ref 0.1–0.9)
Monocytes: 7 %
Neutrophils Absolute: 4.7 10*3/uL (ref 1.4–7.0)
Neutrophils: 53 %
Platelets: 218 10*3/uL (ref 150–450)
RBC: 4.72 x10E6/uL (ref 3.77–5.28)
RDW: 13.2 % (ref 11.7–15.4)
WBC: 8.8 10*3/uL (ref 3.4–10.8)

## 2023-06-24 LAB — VITAMIN D 25 HYDROXY (VIT D DEFICIENCY, FRACTURES): Vit D, 25-Hydroxy: 56.8 ng/mL (ref 30.0–100.0)

## 2023-06-24 LAB — THYROID PANEL WITH TSH
Free Thyroxine Index: 1.8 (ref 1.2–4.9)
T3 Uptake Ratio: 24 % (ref 24–39)
T4, Total: 7.5 ug/dL (ref 4.5–12.0)
TSH: 2.45 u[IU]/mL (ref 0.450–4.500)

## 2023-06-24 LAB — CMP14+EGFR
ALT: 19 [IU]/L (ref 0–32)
AST: 23 [IU]/L (ref 0–40)
Albumin: 4.3 g/dL (ref 3.8–4.9)
Alkaline Phosphatase: 86 [IU]/L (ref 44–121)
BUN/Creatinine Ratio: 27 (ref 12–28)
BUN: 26 mg/dL (ref 8–27)
Bilirubin Total: <0.2 mg/dL (ref 0.0–1.2)
CO2: 22 mmol/L (ref 20–29)
Calcium: 9.5 mg/dL (ref 8.7–10.3)
Chloride: 103 mmol/L (ref 96–106)
Creatinine, Ser: 0.95 mg/dL (ref 0.57–1.00)
Globulin, Total: 2.6 g/dL (ref 1.5–4.5)
Glucose: 97 mg/dL (ref 70–99)
Potassium: 4.7 mmol/L (ref 3.5–5.2)
Sodium: 145 mmol/L — ABNORMAL HIGH (ref 134–144)
Total Protein: 6.9 g/dL (ref 6.0–8.5)
eGFR: 69 mL/min/{1.73_m2} (ref >59)

## 2023-06-24 LAB — HEPATITIS C ANTIBODY: Hep C Virus Ab: NONREACTIVE

## 2023-06-24 LAB — LIPID PANEL
Chol/HDL Ratio: 6.8 ratio — ABNORMAL HIGH (ref 0.0–4.4)
Cholesterol, Total: 257 mg/dL — ABNORMAL HIGH (ref 100–199)
HDL: 38 mg/dL — ABNORMAL LOW (ref >39)
LDL Chol Calc (NIH): 162 mg/dL — ABNORMAL HIGH (ref 0–99)
Triglycerides: 300 mg/dL — ABNORMAL HIGH (ref 0–149)
VLDL Cholesterol Cal: 57 mg/dL — ABNORMAL HIGH (ref 5–40)

## 2023-06-24 LAB — HIV ANTIBODY (ROUTINE TESTING W REFLEX): HIV Screen 4th Generation wRfx: NONREACTIVE

## 2023-07-01 ENCOUNTER — Encounter: Payer: Self-pay | Admitting: *Deleted

## 2023-08-25 ENCOUNTER — Other Ambulatory Visit: Payer: Self-pay | Admitting: Family Medicine

## 2023-09-06 DIAGNOSIS — Z01419 Encounter for gynecological examination (general) (routine) without abnormal findings: Secondary | ICD-10-CM | POA: Diagnosis not present

## 2023-09-06 DIAGNOSIS — Z124 Encounter for screening for malignant neoplasm of cervix: Secondary | ICD-10-CM | POA: Diagnosis not present

## 2023-09-06 LAB — HM PAP SMEAR: HM Pap smear: NEGATIVE

## 2023-09-19 ENCOUNTER — Ambulatory Visit
Admission: RE | Admit: 2023-09-19 | Discharge: 2023-09-19 | Disposition: A | Discharge status: Home / Self Care | Payer: 59 | Source: Ambulatory Visit | Attending: Obstetrics | Admitting: Obstetrics

## 2023-09-19 DIAGNOSIS — Z1231 Encounter for screening mammogram for malignant neoplasm of breast: Secondary | ICD-10-CM

## 2023-09-26 ENCOUNTER — Other Ambulatory Visit: Payer: Self-pay | Admitting: Family Medicine

## 2023-10-05 DIAGNOSIS — D2362 Other benign neoplasm of skin of left upper limb, including shoulder: Secondary | ICD-10-CM | POA: Diagnosis not present

## 2023-10-05 DIAGNOSIS — D225 Melanocytic nevi of trunk: Secondary | ICD-10-CM | POA: Diagnosis not present

## 2023-10-05 DIAGNOSIS — Z85828 Personal history of other malignant neoplasm of skin: Secondary | ICD-10-CM | POA: Diagnosis not present

## 2023-10-05 DIAGNOSIS — L812 Freckles: Secondary | ICD-10-CM | POA: Diagnosis not present

## 2023-10-05 DIAGNOSIS — D2371 Other benign neoplasm of skin of right lower limb, including hip: Secondary | ICD-10-CM | POA: Diagnosis not present

## 2023-10-05 DIAGNOSIS — L570 Actinic keratosis: Secondary | ICD-10-CM | POA: Diagnosis not present

## 2023-10-05 DIAGNOSIS — L438 Other lichen planus: Secondary | ICD-10-CM | POA: Diagnosis not present

## 2023-10-05 DIAGNOSIS — L718 Other rosacea: Secondary | ICD-10-CM | POA: Diagnosis not present

## 2023-10-05 DIAGNOSIS — L821 Other seborrheic keratosis: Secondary | ICD-10-CM | POA: Diagnosis not present

## 2023-12-21 ENCOUNTER — Ambulatory Visit: Payer: 59 | Admitting: Family Medicine

## 2023-12-27 ENCOUNTER — Encounter: Payer: Self-pay | Admitting: Family Medicine

## 2023-12-27 ENCOUNTER — Ambulatory Visit: Payer: 59 | Admitting: Family Medicine

## 2023-12-27 VITALS — BP 132/78 | HR 79 | Temp 97.5°F | Ht 62.0 in | Wt 208.6 lb

## 2023-12-27 DIAGNOSIS — I10 Essential (primary) hypertension: Secondary | ICD-10-CM | POA: Diagnosis not present

## 2023-12-27 DIAGNOSIS — R7309 Other abnormal glucose: Secondary | ICD-10-CM

## 2023-12-27 DIAGNOSIS — Z6838 Body mass index (BMI) 38.0-38.9, adult: Secondary | ICD-10-CM | POA: Diagnosis not present

## 2023-12-27 DIAGNOSIS — L853 Xerosis cutis: Secondary | ICD-10-CM

## 2023-12-27 DIAGNOSIS — E559 Vitamin D deficiency, unspecified: Secondary | ICD-10-CM | POA: Insufficient documentation

## 2023-12-27 DIAGNOSIS — E782 Mixed hyperlipidemia: Secondary | ICD-10-CM | POA: Insufficient documentation

## 2023-12-27 LAB — BAYER DCA HB A1C WAIVED: HB A1C (BAYER DCA - WAIVED): 5.6 % (ref 4.8–5.6)

## 2023-12-27 NOTE — Progress Notes (Signed)
 Subjective:  Patient ID: Sherri Walter, female    DOB: 1961/05/28, 63 y.o.   MRN: [id number]  Patient Care Team: Galvin Jules, FNP as PCP - General (Family Medicine)   Chief Complaint:  Hypertension (6 month follow up )   HPI: Sherri Walter is a 63 y.o. female presenting on 12/27/2023 for Hypertension (6 month follow up )   Sherri Walter is a 63 year old female with hypertension who presents for a follow-up visit.  Her blood pressure has been stable with recent readings of 140 and 132 mmHg. She is currently taking valsartan  160 mg daily but did not take her medication on the morning of the visit. No headaches, chest pain, or leg swelling. She experiences a sensation of tightness in the muscle area around her ankle, attributed to prolonged standing over the years.  She has a knee issue following a fall while cleaning, which has been improving with stretches and topical treatments. No acute injury is suspected.  She experiences stress-related headaches, which have improved recently. She takes vitamin D , approximately 2000 IU daily, although she sometimes finds it difficult to obtain her preferred dosage.  She mentions a sensation in her left ear, described as 'something in my ear,' possibly related to dry skin. She uses earplugs to sleep due to her husband's snoring, which may contribute to the sensation.   Her cholesterol is elevated. She does try to follow a good diet but does not take any medications for the elevated cholesterol. She does have a family history of hyperlipidemia.          Relevant past medical, surgical, family, and social history reviewed and updated as indicated.  Allergies and medications reviewed and updated. Data reviewed: Chart in Epic.   Past Medical History:  Diagnosis Date   Dyslipidemia    Vertigo     Past Surgical History:  Procedure Laterality Date   BREAST BIOPSY Right    benign   TUBAL LIGATION      Social History    Socioeconomic History   Marital status: Married    Spouse name: Not on file   Number of children: Not on file   Years of education: Not on file   Highest education level: Associate degree: occupational, Scientist, product/process development, or vocational program  Occupational History   Not on file  Tobacco Use   Smoking status: Never   Smokeless tobacco: Never  Vaping Use   Vaping status: Never Used  Substance and Sexual Activity   Alcohol use: Yes    Comment: some   Drug use: No   Sexual activity: Not on file  Other Topics Concern   Not on file  Social History Narrative   Not on file   Social Drivers of Health   Financial Resource Strain: Not on file  Food Insecurity: No Food Insecurity (06/22/2023)   Hunger Vital Sign    Worried About Running Out of Food in the Last Year: Never true    Ran Out of Food in the Last Year: Never true  Transportation Needs: No Transportation Needs (06/22/2023)   PRAPARE - Administrator, Civil Service (Medical): No    Lack of Transportation (Non-Medical): No  Physical Activity: Insufficiently Active (06/22/2023)   Exercise Vital Sign    Days of Exercise per Week: 2 days    Minutes of Exercise per Session: 20 min  Stress: No Stress Concern Present (06/22/2023)   [employer] of Occupational Health - Occupational  Stress Questionnaire    Feeling of Stress : Not at all  Social Connections: Unknown (06/22/2023)   Social Connection and Isolation Panel [NHANES]    Frequency of Communication with Friends and Family: More than three times a week    Frequency of Social Gatherings with Friends and Family: Once a week    Attends Religious Services: More than 4 times per year    Active Member of [employer] or Organizations: Not on file    Attends Banker Meetings: Not on file    Marital Status: Married  Intimate Partner Violence: Not on file    Outpatient Encounter Medications as of 12/27/2023  Medication Sig   Cholecalciferol (VITAMIN D  PO)  Take by mouth.   valsartan  (DIOVAN ) 160 MG tablet TAKE ONE TABLET BY MOUTH DAILY FOR BLOOD PRESSURE   [DISCONTINUED] cloNIDine (CATAPRES) 0.1 MG tablet Take 0.1 mg by mouth daily.   No facility-administered encounter medications on file as of 12/27/2023.    Allergies  Allergen Reactions   Augmentin  [Amoxicillin -Pot Clavulanate] Diarrhea   Codeine Other (See Comments) and Nausea Only   Morphine And Codeine     Nausea,itching   Sulfa Antibiotics     Pertinent ROS per HPI, otherwise unremarkable      Objective:  BP 132/78   Pulse 79   Temp (!) 97.5 F (36.4 C)   Ht 5\' 2"  (1.575 m)   Wt 208 lb 9.6 oz (94.6 kg)   LMP 06/28/2013   SpO2 97%   BMI 38.15 kg/m    Wt Readings from Last 3 Encounters:  12/27/23 208 lb 9.6 oz (94.6 kg)  06/23/23 200 lb 6 oz (90.9 kg)  03/16/23 200 lb 12.8 oz (91.1 kg)    Physical Exam Vitals and nursing note reviewed.  Constitutional:      Appearance: Normal appearance. She is obese.  HENT:     Head: Normocephalic and atraumatic.     Right Ear: Tympanic membrane, ear canal and external ear normal. There is no impacted cerumen.     Ears:     Comments: Dry skin in left ear canal. No cerumen buildup.     Nose: Nose normal.     Mouth/Throat:     Mouth: Mucous membranes are moist.  Eyes:     Conjunctiva/sclera: Conjunctivae normal.     Pupils: Pupils are equal, round, and reactive to light.  Cardiovascular:     Rate and Rhythm: Normal rate and regular rhythm.     Heart sounds: Normal heart sounds.  Pulmonary:     Effort: Pulmonary effort is normal.     Breath sounds: Normal breath sounds.  Abdominal:     General: Bowel sounds are normal.     Palpations: Abdomen is soft.  Musculoskeletal:     Cervical back: Neck supple.     Right lower leg: No edema.     Left lower leg: No edema.  Skin:    General: Skin is warm and dry.     Capillary Refill: Capillary refill takes less than 2 seconds.  Neurological:     General: No focal deficit  present.     Mental Status: She is alert and oriented to person, place, and time.  Psychiatric:        Mood and Affect: Mood normal.        Behavior: Behavior normal.        Thought Content: Thought content normal.        Judgment: Judgment normal.  Results for orders placed or performed in visit on 09/19/23  HM PAP SMEAR   Collection Time: 09/06/23 12:00 AM  Result Value Ref Range   HM Pap smear neg        Pertinent labs & imaging results that were available during my care of the patient were reviewed by me and considered in my medical decision making.  Assessment & Plan:  Sherri Walter was seen today for hypertension.  Diagnoses and all orders for this visit:  Primary hypertension -     CMP14+EGFR -     CBC with Differential/Platelet -     Thyroid  Panel With TSH  BMI 38.0-38.9,adult -     Lipid panel -     CMP14+EGFR -     CBC with Differential/Platelet -     Thyroid  Panel With TSH -     Bayer DCA Hb A1c Waived  Mixed hyperlipidemia -     Lipid panel -     CMP14+EGFR  Elevated glucose -     CMP14+EGFR -     Bayer DCA Hb A1c Waived  Vitamin D  deficiency -     VITAMIN D  25 Hydroxy (Vit-D Deficiency, Fractures)  Dry skin Treatment at home discussed.     Assessment and Plan    Hypertension Hypertension is well-controlled with current medication regimen. Blood pressure readings were 140 and 132 mmHg. No symptoms such as headaches, chest pain, or peripheral edema. - Continue valsartan  160 mg daily - Obtain labs today - Schedule physical examination for November or December  Hyperlipidemia Hyperlipidemia management discussed. She is not on medication for cholesterol. Red yeast rice recommended as a natural alternative to statins. Plan to monitor cholesterol levels and reassess in six months. - Start red yeast rice 2400 mg daily - Obtain labs today to check cholesterol levels - Reassess cholesterol levels in six months and consider statin if necessary  Dry skin  in ear Dry skin in the left ear, likely due to earplug use and water exposure. No cerumen impaction. - Apply Cerave healing ointment to the affected area using a cotton swab           Continue all other maintenance medications.  Follow up plan: Return in about 6 months (around 06/28/2024) for Annual Physical.   Continue healthy lifestyle choices, including diet (rich in fruits, vegetables, and lean proteins, and low in salt and simple carbohydrates) and exercise (at least 30 minutes of moderate physical activity daily).  Educational handout given for DASH diet, HTN  The above assessment and management plan was discussed with the patient. The patient verbalized understanding of and has agreed to the management plan. Patient is aware to call the clinic if they develop any new symptoms or if symptoms persist or worsen. Patient is aware when to return to the clinic for a follow-up visit. Patient educated on when it is appropriate to go to the emergency department.   Kattie Parrot, FNP-C [facility] [phone]

## 2023-12-27 NOTE — Patient Instructions (Addendum)
 Red yeast Rice 2400 mg daily for cholesterol    Goal BP:  For patients younger than 60: Goal BP < 140/90. For patients 60 and older: Goal BP < 150/90. For patients with diabetes: Goal BP < 140/90.  Take your medications faithfully as prescribed. Maintain a healthy weight. Get at least 150 minutes of aerobic exercise per week. Minimize salt intake, less than 2000 mg per day. Minimize alcohol intake.  DASH Eating Plan DASH stands for "Dietary Approaches to Stop Hypertension." The DASH eating plan is a healthy eating plan that has been shown to reduce high blood pressure (hypertension). Additional health benefits may include reducing the risk of type 2 diabetes mellitus, heart disease, and stroke. The DASH eating plan may also help with weight loss.  WHAT DO I NEED TO KNOW ABOUT THE DASH EATING PLAN? For the DASH eating plan, you will follow these general guidelines: Choose foods with a percent daily value for sodium of less than 5% (as listed on the food label). Use salt-free seasonings or herbs instead of table salt or sea salt. Check with your health care provider or pharmacist before using salt substitutes. Eat lower-sodium products, often labeled as "lower sodium" or "no salt added." Eat fresh foods. Eat more vegetables, fruits, and low-fat dairy products. Choose whole grains. Look for the word "whole" as the first word in the ingredient list. Choose fish and skinless chicken or [place] more often than red meat. Limit fish, poultry, and meat to 6 oz (170 g) each day. Limit sweets, desserts, sugars, and sugary drinks. Choose heart-healthy fats. Limit cheese to 1 oz (28 g) per day. Eat more home-cooked food and less restaurant, buffet, and fast food. Limit fried foods. Cook foods using methods other than frying. Limit canned vegetables. If you do use them, rinse them well to decrease the sodium. When eating at a restaurant, ask that your food be prepared with less salt, or no salt if  possible.  WHAT FOODS CAN I EAT? Seek help from a dietitian for individual calorie needs.  Grains Whole grain or whole wheat bread. Brown rice. Whole grain or whole wheat pasta. Quinoa, bulgur, and whole grain cereals. Low-sodium cereals. Corn or whole wheat flour tortillas. Whole grain cornbread. Whole grain crackers. Low-sodium crackers.  Vegetables Fresh or frozen vegetables (raw, steamed, roasted, or grilled). Low-sodium or reduced-sodium tomato and vegetable juices. Low-sodium or reduced-sodium tomato sauce and paste. Low-sodium or reduced-sodium canned vegetables.   Fruits All fresh, canned (in natural juice), or frozen fruits.  Meat and Other Protein Products Ground beef (85% or leaner), grass-fed beef, or beef trimmed of fat. Skinless chicken or [place]. Ground chicken or [place]. Pork trimmed of fat. All fish and seafood. Eggs. Dried beans, peas, or lentils. Unsalted nuts and seeds. Unsalted canned beans.  Dairy Low-fat dairy products, such as skim or 1% milk, 2% or reduced-fat cheeses, low-fat ricotta or cottage cheese, or plain low-fat yogurt. Low-sodium or reduced-sodium cheeses.  Fats and Oils Tub margarines without trans fats. Light or reduced-fat mayonnaise and salad dressings (reduced sodium). Avocado. Safflower, olive, or canola oils. Natural peanut or almond butter.  Other Unsalted popcorn and pretzels. The items listed above may not be a complete list of recommended foods or beverages. Contact your dietitian for more options.  WHAT FOODS ARE NOT RECOMMENDED?  Grains White bread. White pasta. White rice. Refined cornbread. Bagels and croissants. Crackers that contain trans fat.  Vegetables Creamed or fried vegetables. Vegetables in a cheese sauce. Regular canned vegetables. Regular  canned tomato sauce and paste. Regular tomato and vegetable juices.  Fruits Dried fruits. Canned fruit in light or heavy syrup. Fruit juice.  Meat and Other Protein Products Fatty  cuts of meat. Ribs, chicken wings, bacon, sausage, bologna, salami, chitterlings, fatback, hot dogs, bratwurst, and packaged luncheon meats. Salted nuts and seeds. Canned beans with salt.  Dairy Whole or 2% milk, cream, half-and-half, and cream cheese. Whole-fat or sweetened yogurt. Full-fat cheeses or blue cheese. Nondairy creamers and whipped toppings. Processed cheese, cheese spreads, or cheese curds.  Condiments Onion and garlic salt, seasoned salt, table salt, and sea salt. Canned and packaged gravies. Worcestershire sauce. Tartar sauce. Barbecue sauce. Teriyaki sauce. Soy sauce, including reduced sodium. Steak sauce. Fish sauce. Oyster sauce. Cocktail sauce. Horseradish. Ketchup and mustard. Meat flavorings and tenderizers. Bouillon cubes. Hot sauce. Tabasco sauce. Marinades. Taco seasonings. Relishes.  Fats and Oils Butter, stick margarine, lard, shortening, ghee, and bacon fat. Coconut, palm kernel, or palm oils. Regular salad dressings.  Other Pickles and olives. Salted popcorn and pretzels.  The items listed above may not be a complete list of foods and beverages to avoid. Contact your dietitian for more information.  WHERE CAN I FIND MORE INFORMATION? National Heart, Lung, and Blood Institute: [email or web address] Document Released: 07/15/2011 Document Revised: 12/10/2013 Document Reviewed: 05/30/2013 [facility] Patient Information 2015 [place], [place]. This information is not intended to replace advice given to you by your health care provider. Make sure you discuss any questions you have with your health care provider.   I think that you would greatly benefit from seeing a nutritionist.  If you are interested, please call Dr. Florinda Hush at [phone] to schedule an appointment.

## 2023-12-28 ENCOUNTER — Ambulatory Visit: Payer: Self-pay | Admitting: Family Medicine

## 2023-12-28 LAB — CBC WITH DIFFERENTIAL/PLATELET
Basophils Absolute: 0.1 10*3/uL (ref 0.0–0.2)
Basos: 1 %
EOS (ABSOLUTE): 0.1 10*3/uL (ref 0.0–0.4)
Eos: 2 %
Hematocrit: 44.7 % (ref 34.0–46.6)
Hemoglobin: 14.6 g/dL (ref 11.1–15.9)
Immature Grans (Abs): 0 10*3/uL (ref 0.0–0.1)
Immature Granulocytes: 0 %
Lymphocytes Absolute: 2.3 10*3/uL (ref 0.7–3.1)
Lymphs: 31 %
MCH: 29.4 pg (ref 26.6–33.0)
MCHC: 32.7 g/dL (ref 31.5–35.7)
MCV: 90 fL (ref 79–97)
Monocytes Absolute: 0.5 10*3/uL (ref 0.1–0.9)
Monocytes: 6 %
Neutrophils Absolute: 4.5 10*3/uL (ref 1.4–7.0)
Neutrophils: 60 %
Platelets: 191 10*3/uL (ref 150–450)
RBC: 4.96 x10E6/uL (ref 3.77–5.28)
RDW: 13.7 % (ref 11.7–15.4)
WBC: 7.5 10*3/uL (ref 3.4–10.8)

## 2023-12-28 LAB — CMP14+EGFR
ALT: 21 IU/L (ref 0–32)
AST: 20 IU/L (ref 0–40)
Albumin: 4.1 g/dL (ref 3.9–4.9)
Alkaline Phosphatase: 89 IU/L (ref 44–121)
BUN/Creatinine Ratio: 20 (ref 12–28)
BUN: 19 mg/dL (ref 8–27)
Bilirubin Total: 0.3 mg/dL (ref 0.0–1.2)
CO2: 23 mmol/L (ref 20–29)
Calcium: 9.1 mg/dL (ref 8.7–10.3)
Chloride: 100 mmol/L (ref 96–106)
Creatinine, Ser: 0.93 mg/dL (ref 0.57–1.00)
Globulin, Total: 2.5 g/dL (ref 1.5–4.5)
Glucose: 120 mg/dL — ABNORMAL HIGH (ref 70–99)
Potassium: 4.6 mmol/L (ref 3.5–5.2)
Sodium: 139 mmol/L (ref 134–144)
Total Protein: 6.6 g/dL (ref 6.0–8.5)
eGFR: 70 mL/min/{1.73_m2} (ref >59)

## 2023-12-28 LAB — THYROID PANEL WITH TSH
Free Thyroxine Index: 1.8 (ref 1.2–4.9)
T3 Uptake Ratio: 25 % (ref 24–39)
T4, Total: 7.2 ug/dL (ref 4.5–12.0)
TSH: 2.77 u[IU]/mL (ref 0.450–4.500)

## 2023-12-28 LAB — LIPID PANEL
Chol/HDL Ratio: 5.9 ratio — ABNORMAL HIGH (ref 0.0–4.4)
Cholesterol, Total: 230 mg/dL — ABNORMAL HIGH (ref 100–199)
HDL: 39 mg/dL — ABNORMAL LOW (ref >39)
LDL Chol Calc (NIH): 163 mg/dL — ABNORMAL HIGH (ref 0–99)
Triglycerides: 150 mg/dL — ABNORMAL HIGH (ref 0–149)
VLDL Cholesterol Cal: 28 mg/dL (ref 5–40)

## 2023-12-28 LAB — VITAMIN D 25 HYDROXY (VIT D DEFICIENCY, FRACTURES): Vit D, 25-Hydroxy: 47 ng/mL (ref 30.0–100.0)

## 2024-02-24 ENCOUNTER — Other Ambulatory Visit: Payer: Self-pay | Admitting: Family Medicine

## 2024-06-29 ENCOUNTER — Ambulatory Visit (INDEPENDENT_AMBULATORY_CARE_PROVIDER_SITE_OTHER): Payer: Self-pay | Admitting: Family Medicine

## 2024-06-29 ENCOUNTER — Encounter: Payer: Self-pay | Admitting: Family Medicine

## 2024-06-29 VITALS — BP 133/72 | HR 68 | Temp 97.8°F | Ht 62.0 in | Wt 206.2 lb

## 2024-06-29 DIAGNOSIS — Z0001 Encounter for general adult medical examination with abnormal findings: Secondary | ICD-10-CM | POA: Diagnosis not present

## 2024-06-29 DIAGNOSIS — Z0000 Encounter for general adult medical examination without abnormal findings: Secondary | ICD-10-CM

## 2024-06-29 DIAGNOSIS — E782 Mixed hyperlipidemia: Secondary | ICD-10-CM | POA: Diagnosis not present

## 2024-06-29 DIAGNOSIS — Z23 Encounter for immunization: Secondary | ICD-10-CM

## 2024-06-29 DIAGNOSIS — I10 Essential (primary) hypertension: Secondary | ICD-10-CM | POA: Diagnosis not present

## 2024-06-29 DIAGNOSIS — E559 Vitamin D deficiency, unspecified: Secondary | ICD-10-CM

## 2024-06-29 LAB — LIPID PANEL

## 2024-06-29 MED ORDER — VALSARTAN 160 MG PO TABS: 160.0000 mg | ORAL_TABLET | Freq: Every day | ORAL | 3 refills | Status: AC

## 2024-06-29 NOTE — Progress Notes (Signed)
 Complete physical exam  Patient: Sherri Walter   DOB: Aug 25, 1960   63 y.o. Female  MRN: [id number]  Subjective:    Chief Complaint  Patient presents with   Annual Exam    Sherri Walter is a 63 y.o. female who presents today for a complete physical exam. She reports consuming a low fat and low sodium diet. Home exercise routine includes walking 1 hrs per week. She generally feels well. She reports sleeping well. She does not have additional problems to discuss today.    Most recent fall risk assessment:    06/29/2024    8:13 AM  Fall Risk   Falls in the past year? 0  Risk for fall due to : No Fall Risks  Follow up Falls evaluation completed     Most recent depression screenings:    06/29/2024    8:13 AM 12/27/2023    8:04 AM  PHQ 2/9 Scores  PHQ - 2 Score 0 1  PHQ- 9 Score 1 3      Data saved with a previous flowsheet row definition    Vision:Within last year and Dental: No current dental problems and Receives regular dental care  Patient Active Problem List   Diagnosis Date Noted   Mixed hyperlipidemia 12/27/2023   Vitamin D  deficiency 12/27/2023   Primary hypertension 03/16/2023   BMI 38.0-38.9,adult 03/16/2023   Uterine leiomyoma 05/03/2022   Herpesvirus infection 08/24/2021   Past Medical History:  Diagnosis Date   Dyslipidemia    Vertigo    Past Surgical History:  Procedure Laterality Date   BREAST BIOPSY Right    benign   TUBAL LIGATION     Social History   Tobacco Use   Smoking status: Never   Smokeless tobacco: Never  Vaping Use   Vaping status: Never Used  Substance Use Topics   Alcohol use: Yes    Comment: some   Drug use: No   Social History   Socioeconomic History   Marital status: Married    Spouse name: Not on file   Number of children: Not on file   Years of education: Not on file   Highest education level: Associate degree: occupational, scientist, product/process development, or vocational program  Occupational History   Not on file   Tobacco Use   Smoking status: Never   Smokeless tobacco: Never  Vaping Use   Vaping status: Never Used  Substance and Sexual Activity   Alcohol use: Yes    Comment: some   Drug use: No   Sexual activity: Not on file  Other Topics Concern   Not on file  Social History Narrative   Not on file   Social Drivers of Health   Financial Resource Strain: Low Risk  (06/27/2024)   Overall Financial Resource Strain (CARDIA)    Difficulty of Paying Living Expenses: Not very hard  Food Insecurity: No Food Insecurity (06/27/2024)   Hunger Vital Sign    Worried About Running Out of Food in the Last Year: Never true    Ran Out of Food in the Last Year: Never true  Transportation Needs: No Transportation Needs (06/27/2024)   PRAPARE - Administrator, Civil Service (Medical): No    Lack of Transportation (Non-Medical): No  Physical Activity: Insufficiently Active (06/27/2024)   Exercise Vital Sign    Days of Exercise per Week: 2 days    Minutes of Exercise per Session: 10 min  Stress: No Stress Concern Present (06/27/2024)   [employer]  of Occupational Health - Occupational Stress Questionnaire    Feeling of Stress: Not at all  Social Connections: Socially Integrated (06/27/2024)   Social Connection and Isolation Panel    Frequency of Communication with Friends and Family: Three times a week    Frequency of Social Gatherings with Friends and Family: Once a week    Attends Religious Services: More than 4 times per year    Active Member of [employer] or Organizations: Yes    Attends Banker Meetings: 1 to 4 times per year    Marital Status: Married  Catering Manager Violence: Not on file   Family Status  Relation Name Status   Mother  Deceased   Father  Deceased   Sister  Alive   Brother  Deceased   Neg Hx  (Not Specified)  No partnership data on file   Family History  Problem Relation Age of Onset   Arthritis Mother        DDD,SPINAL STENOSIS   Heart  disease Father        Heart attack at age 70   Hypertension Father    Hypertension Sister    Hyperlipidemia Sister    Arthritis Sister    Thyroid  disease Sister    Alcohol abuse Brother    Breast cancer Neg Hx    Allergies  Allergen Reactions   Augmentin  [Amoxicillin -Pot Clavulanate] Diarrhea   Codeine Other (See Comments) and Nausea Only   Morphine And Codeine     Nausea,itching   Sulfa Antibiotics       Patient Care Team: Severa Rock HERO, FNP as PCP - General (Family Medicine)   Outpatient Medications Prior to Visit  Medication Sig   Cholecalciferol (VITAMIN D  PO) Take by mouth.   Red Yeast Rice Extract (RED YEAST RICE PO) Take by mouth.   [DISCONTINUED] valsartan  (DIOVAN ) 160 MG tablet TAKE ONE TABLET BY MOUTH DAILY FOR BLOOD PRESSURE   No facility-administered medications prior to visit.    ROS per HPI      Objective:     BP 133/72   Pulse 68   Temp 97.8 F (36.6 C)   Ht 5' 2 (1.575 m)   Wt 206 lb 3.2 oz (93.5 kg)   LMP 06/28/2013   SpO2 98%   BMI 37.71 kg/m  BP Readings from Last 3 Encounters:  06/29/24 133/72  12/27/23 132/78  06/23/23 135/65   Wt Readings from Last 3 Encounters:  06/29/24 206 lb 3.2 oz (93.5 kg)  12/27/23 208 lb 9.6 oz (94.6 kg)  06/23/23 200 lb 6 oz (90.9 kg)   SpO2 Readings from Last 3 Encounters:  06/29/24 98%  12/27/23 97%  06/23/23 98%      Physical Exam Vitals and nursing note reviewed.  Constitutional:      General: She is not in acute distress.    Appearance: Normal appearance. She is well-developed and well-groomed. She is morbidly obese. She is not ill-appearing, toxic-appearing or diaphoretic.  HENT:     Head: Normocephalic and atraumatic.     Jaw: There is normal jaw occlusion.     Right Ear: Hearing, tympanic membrane, ear canal and external ear normal.     Left Ear: Hearing, tympanic membrane, ear canal and external ear normal.     Nose: Nose normal.     Mouth/Throat:     Lips: Pink.     Mouth: Mucous  membranes are moist.     Pharynx: Oropharynx is clear. Uvula midline.  Eyes:  General: Lids are normal.     Extraocular Movements: Extraocular movements intact.     Conjunctiva/sclera: Conjunctivae normal.     Pupils: Pupils are equal, round, and reactive to light.  Neck:     Thyroid : No thyroid  mass, thyromegaly or thyroid  tenderness.     Vascular: No carotid bruit or JVD.     Trachea: Trachea and phonation normal.  Cardiovascular:     Rate and Rhythm: Normal rate and regular rhythm.     Chest Wall: PMI is not displaced.     Pulses: Normal pulses.     Heart sounds: Normal heart sounds. No murmur heard.    No friction rub. No gallop.  Pulmonary:     Effort: Pulmonary effort is normal. No respiratory distress.     Breath sounds: Normal breath sounds. No wheezing.  Abdominal:     General: Bowel sounds are normal. There is no distension or abdominal bruit.     Palpations: Abdomen is soft. There is no hepatomegaly or splenomegaly.     Tenderness: There is no abdominal tenderness. There is no right CVA tenderness or left CVA tenderness.     Hernia: No hernia is present.  Musculoskeletal:        General: Normal range of motion.     Cervical back: Normal range of motion and neck supple.     Right lower leg: No edema.     Left lower leg: No edema.       Feet:  Lymphadenopathy:     Cervical: No cervical adenopathy.  Skin:    General: Skin is warm and dry.     Capillary Refill: Capillary refill takes less than 2 seconds.     Coloration: Skin is not cyanotic, jaundiced or pale.     Findings: No rash.  Neurological:     General: No focal deficit present.     Mental Status: She is alert and oriented to person, place, and time.     Sensory: Sensation is intact.     Motor: Motor function is intact.     Coordination: Coordination is intact.     Gait: Gait is intact.     Deep Tendon Reflexes: Reflexes are normal and symmetric.  Psychiatric:        Attention and Perception: Attention  and perception normal.        Mood and Affect: Mood and affect normal.        Speech: Speech normal.        Behavior: Behavior normal. Behavior is cooperative.        Thought Content: Thought content normal.        Cognition and Memory: Cognition and memory normal.        Judgment: Judgment normal.       Last CBC Lab Results  Component Value Date   WBC 7.5 12/27/2023   HGB 14.6 12/27/2023   HCT 44.7 12/27/2023   MCV 90 12/27/2023   MCH 29.4 12/27/2023   RDW 13.7 12/27/2023   PLT 191 12/27/2023   Last metabolic panel Lab Results  Component Value Date   GLUCOSE 120 (H) 12/27/2023   NA 139 12/27/2023   K 4.6 12/27/2023   CL 100 12/27/2023   CO2 23 12/27/2023   BUN 19 12/27/2023   CREATININE 0.93 12/27/2023   EGFR 70 12/27/2023   CALCIUM  9.1 12/27/2023   PROT 6.6 12/27/2023   ALBUMIN 4.1 12/27/2023   LABGLOB 2.5 12/27/2023   AGRATIO 1.7 04/01/2022   BILITOT 0.3  12/27/2023   ALKPHOS 89 12/27/2023   AST 20 12/27/2023   ALT 21 12/27/2023   Last lipids Lab Results  Component Value Date   CHOL 230 (H) 12/27/2023   HDL 39 (L) 12/27/2023   LDLCALC 163 (H) 12/27/2023   TRIG 150 (H) 12/27/2023   CHOLHDL 5.9 (H) 12/27/2023   Last hemoglobin A1c Lab Results  Component Value Date   HGBA1C 5.6 12/27/2023   Last thyroid  functions Lab Results  Component Value Date   TSH 2.770 12/27/2023   T4TOTAL 7.2 12/27/2023   Last vitamin D  Lab Results  Component Value Date   VD25OH 47.0 12/27/2023       Assessment & Plan:    Routine Health Maintenance and Physical Exam  Immunization History  Administered Date(s) Administered   Moderna Covid-19 Fall Seasonal Vaccine 44yrs & older 06/13/2023   Moderna Covid-19 Vaccine Bivalent Booster 80yrs & up 06/23/2021   Moderna SARS-COV2 Booster Vaccination 06/25/2020   Moderna Sars-Covid-2 Vaccination 10/25/2019, 11/23/2019, 01/27/2021   PNEUMOCOCCAL CONJUGATE-20 06/29/2024   Tdap 02/21/2016   Zoster Recombinant(Shingrix)  09/09/2020, 12/07/2020    Health Maintenance  Topic Date Due   COVID-19 Vaccine (7 - 2025-26 season) 07/15/2024 (Originally 04/09/2024)   Influenza Vaccine  11/06/2024 (Originally 03/09/2024)   Mammogram  09/18/2025   DTaP/Tdap/Td (2 - Td or Tdap) 02/20/2026   Cervical Cancer Screening (HPV/Pap Cotest)  09/05/2026   Colonoscopy  07/01/2030   Pneumococcal Vaccine: 50+ Years  Completed   Hepatitis C Screening  Completed   HIV Screening  Completed   Zoster Vaccines- Shingrix  Completed   Hepatitis B Vaccines 19-59 Average Risk  Aged Out   HPV VACCINES  Aged Out   Meningococcal B Vaccine  Aged Out    Discussed health benefits of physical activity, and encouraged her to engage in regular exercise appropriate for her age and condition.  Problem List Items Addressed This Visit       Cardiovascular and Mediastinum   Primary hypertension   Relevant Medications   valsartan  (DIOVAN ) 160 MG tablet   Other Relevant Orders   CBC with Differential/Platelet   CMP14+EGFR   Lipid panel   TSH   T4, free     Other   Mixed hyperlipidemia   Relevant Medications   valsartan  (DIOVAN ) 160 MG tablet   Other Relevant Orders   CMP14+EGFR   Lipid panel   Vitamin D  deficiency   Relevant Orders   CMP14+EGFR   Vitamin D , 25-hydroxy   Other Visit Diagnoses       Annual physical exam    -  Primary   Relevant Orders   CBC with Differential/Platelet   CMP14+EGFR   Lipid panel     Immunization due       Relevant Orders   Pneumococcal conjugate vaccine 20-valent (Prevnar 20) (Completed)      Return if symptoms worsen or fail to improve.     Rosaline Bruns, FNP

## 2024-06-30 LAB — LIPID PANEL
Cholesterol, Total: 247 mg/dL — AB (ref 100–199)
HDL: 42 mg/dL (ref >39)
LDL CALC COMMENT:: 5.9 ratio — AB (ref 0.0–4.4)
LDL Chol Calc (NIH): 178 mg/dL — AB (ref 0–99)
Triglycerides: 149 mg/dL (ref 0–149)
VLDL Cholesterol Cal: 27 mg/dL (ref 5–40)

## 2024-06-30 LAB — CBC WITH DIFFERENTIAL/PLATELET
Basophils Absolute: 0.1 x10E3/uL (ref 0.0–0.2)
Basos: 1 %
EOS (ABSOLUTE): 0.1 x10E3/uL (ref 0.0–0.4)
Eos: 1 %
Hematocrit: 45.9 % (ref 34.0–46.6)
Hemoglobin: 14.8 g/dL (ref 11.1–15.9)
Immature Grans (Abs): 0 x10E3/uL (ref 0.0–0.1)
Immature Granulocytes: 0 %
Lymphocytes Absolute: 2.5 x10E3/uL (ref 0.7–3.1)
Lymphs: 30 %
MCH: 29.2 pg (ref 26.6–33.0)
MCHC: 32.2 g/dL (ref 31.5–35.7)
MCV: 91 fL (ref 79–97)
Monocytes Absolute: 0.6 x10E3/uL (ref 0.1–0.9)
Monocytes: 7 %
Neutrophils Absolute: 5.1 x10E3/uL (ref 1.4–7.0)
Neutrophils: 61 %
Platelets: 209 x10E3/uL (ref 150–450)
RBC: 5.07 x10E6/uL (ref 3.77–5.28)
RDW: 13.4 % (ref 11.7–15.4)
WBC: 8.3 x10E3/uL (ref 3.4–10.8)

## 2024-06-30 LAB — CMP14+EGFR
ALT: 22 IU/L (ref 0–32)
AST: 21 IU/L (ref 0–40)
Albumin: 4.1 g/dL (ref 3.9–4.9)
Alkaline Phosphatase: 100 IU/L (ref 49–135)
BUN/Creatinine Ratio: 19 (ref 12–28)
BUN: 19 mg/dL (ref 8–27)
Bilirubin Total: 0.5 mg/dL (ref 0.0–1.2)
CO2: 25 mmol/L (ref 20–29)
Calcium: 9.2 mg/dL (ref 8.7–10.3)
Chloride: 101 mmol/L (ref 96–106)
Creatinine, Ser: 0.98 mg/dL (ref 0.57–1.00)
Globulin, Total: 2.8 g/dL (ref 1.5–4.5)
Glucose: 90 mg/dL (ref 70–99)
Potassium: 5.3 mmol/L — ABNORMAL HIGH (ref 3.5–5.2)
Sodium: 140 mmol/L (ref 134–144)
Total Protein: 6.9 g/dL (ref 6.0–8.5)
eGFR: 66 mL/min/1.73 (ref >59)

## 2024-06-30 LAB — T4, FREE: Free T4: 1.07 ng/dL (ref 0.82–1.77)

## 2024-06-30 LAB — VITAMIN D 25 HYDROXY (VIT D DEFICIENCY, FRACTURES): Vit D, 25-Hydroxy: 64.7 ng/mL (ref 30.0–100.0)

## 2024-06-30 LAB — TSH: TSH: 2.45 u[IU]/mL (ref 0.450–4.500)

## 2024-07-01 ENCOUNTER — Ambulatory Visit: Payer: Self-pay | Admitting: Family Medicine

## 2024-07-01 DIAGNOSIS — E782 Mixed hyperlipidemia: Secondary | ICD-10-CM

## 2024-07-03 MED ORDER — ATORVASTATIN CALCIUM 20 MG PO TABS: 20.0000 mg | ORAL_TABLET | Freq: Every day | ORAL | 3 refills | Status: AC

## 2024-08-20 ENCOUNTER — Ambulatory Visit (HOSPITAL_COMMUNITY)
Admission: RE | Admit: 2024-08-20 | Discharge: 2024-08-20 | Disposition: A | Discharge status: Home / Self Care | Source: Ambulatory Visit | Attending: Surgery | Admitting: Surgery

## 2024-08-20 ENCOUNTER — Other Ambulatory Visit (HOSPITAL_COMMUNITY): Payer: Self-pay | Admitting: Medical

## 2024-08-20 DIAGNOSIS — R609 Edema, unspecified: Secondary | ICD-10-CM

## 2024-08-28 ENCOUNTER — Other Ambulatory Visit: Payer: Self-pay | Admitting: Obstetrics

## 2024-08-28 DIAGNOSIS — Z1231 Encounter for screening mammogram for malignant neoplasm of breast: Secondary | ICD-10-CM

## 2024-09-19 ENCOUNTER — Ambulatory Visit

## 2025-07-12 ENCOUNTER — Encounter: Admitting: Family Medicine
